# Patient Record
Sex: Male | Born: 1945
Health system: Southern US, Community
[De-identification: ages and names within clinical notes are randomized; demographics above are authoritative.]

## PROBLEM LIST (undated history)

## (undated) DIAGNOSIS — E039 Hypothyroidism, unspecified: Secondary | ICD-10-CM

## (undated) DIAGNOSIS — K4090 Unilateral inguinal hernia, without obstruction or gangrene, not specified as recurrent: Secondary | ICD-10-CM

## (undated) DIAGNOSIS — I10 Essential (primary) hypertension: Secondary | ICD-10-CM

## (undated) DIAGNOSIS — E785 Hyperlipidemia, unspecified: Secondary | ICD-10-CM

## (undated) DIAGNOSIS — C801 Malignant (primary) neoplasm, unspecified: Secondary | ICD-10-CM

## (undated) HISTORY — DX: Hyperlipidemia, unspecified: E78.5

## (undated) HISTORY — PX: BASAL CELL CARCINOMA EXCISION: SHX1214

## (undated) HISTORY — DX: Essential (primary) hypertension: I10

## (undated) HISTORY — DX: Malignant (primary) neoplasm, unspecified: C80.1

## (undated) HISTORY — DX: Hypothyroidism, unspecified: E03.9

## (undated) HISTORY — DX: Unilateral inguinal hernia, without obstruction or gangrene, not specified as recurrent: K40.90

---

## 2009-05-22 DIAGNOSIS — I10 Essential (primary) hypertension: Secondary | ICD-10-CM | POA: Insufficient documentation

## 2009-07-25 ENCOUNTER — Ambulatory Visit: Payer: Self-pay | Admitting: Gastroenterology

## 2009-07-25 LAB — HM COLONOSCOPY: HM Colonoscopy: NORMAL

## 2009-10-23 DIAGNOSIS — N399 Disorder of urinary system, unspecified: Secondary | ICD-10-CM | POA: Insufficient documentation

## 2009-10-23 DIAGNOSIS — E039 Hypothyroidism, unspecified: Secondary | ICD-10-CM | POA: Insufficient documentation

## 2009-11-17 DIAGNOSIS — G4730 Sleep apnea, unspecified: Secondary | ICD-10-CM | POA: Insufficient documentation

## 2009-11-17 DIAGNOSIS — E669 Obesity, unspecified: Secondary | ICD-10-CM | POA: Insufficient documentation

## 2010-09-08 DIAGNOSIS — C801 Malignant (primary) neoplasm, unspecified: Secondary | ICD-10-CM

## 2010-09-08 HISTORY — DX: Malignant (primary) neoplasm, unspecified: C80.1

## 2010-09-08 HISTORY — PX: COLONOSCOPY: SHX174

## 2013-06-14 ENCOUNTER — Ambulatory Visit (INDEPENDENT_AMBULATORY_CARE_PROVIDER_SITE_OTHER): Payer: Managed Care, Other (non HMO) | Admitting: General Surgery

## 2013-06-14 ENCOUNTER — Encounter: Payer: Self-pay | Admitting: General Surgery

## 2013-06-14 VITALS — BP 140/76 | HR 80 | Resp 12 | Ht 70.0 in | Wt 220.0 lb

## 2013-06-14 DIAGNOSIS — K4090 Unilateral inguinal hernia, without obstruction or gangrene, not specified as recurrent: Secondary | ICD-10-CM

## 2013-06-14 NOTE — Progress Notes (Signed)
Patient ID: Larry Hernandez, male   DOB: [birth year], 67 y.o.   MRN: [id number]  Chief Complaint  Patient presents with  . Other    inguinal hernia    HPI Larry Hernandez is a 67 y.o. male here today for evaluation of a left inguinal hernia . Patient states that DR. Sullivan Lone    noticed this about three weeks ago in his yearly physical and wanted to get it looked at. Patient states no pain in that area. HPI  Past Medical History  Diagnosis Date  . Hypertension   . Hypothyroid   . Cancer 2012    skin caner face and ear    Past Surgical History  Procedure Laterality Date  . Colonoscopy  2012    History reviewed. No pertinent family history.  Social History History  Substance Use Topics  . Smoking status: Never Smoker   . Smokeless tobacco: Never Used  . Alcohol Use: Not on file    No Known Allergies  Current Outpatient Prescriptions  Medication Sig Dispense Refill  . levothyroxine (SYNTHROID, LEVOTHROID) 100 MCG tablet Take 100 mcg by mouth daily before breakfast.       . simvastatin (ZOCOR) 10 MG tablet Take 10 mg by mouth at bedtime.       [name] aspirin 325 MG tablet Take 325 mg by mouth as needed for pain.       No current facility-administered medications for this visit.    Review of Systems Review of Systems  Constitutional: Negative.   Respiratory: Negative.     Blood pressure 140/76, pulse 80, resp. rate 12, height 5\' 10"  (1.778 m), weight 220 lb (99.791 kg).  Physical Exam Physical Exam  Constitutional: He is oriented to person, place, and time. He appears well-developed and well-nourished.  Eyes: Conjunctivae are normal. No scleral icterus.  Neck: No mass and no thyromegaly present.  Cardiovascular: Normal rate, regular rhythm and normal heart sounds.   Pulmonary/Chest: Breath sounds normal.  Abdominal: Soft. Normal appearance and bowel sounds are normal. There is no hepatomegaly. There is no tenderness. A hernia is present. Hernia confirmed positive in the left  inguinal area (medium).  Lymphadenopathy:    He has no cervical adenopathy.  Neurological: He is alert and oriented to person, place, and time.  Skin: Skin is warm and dry.    Data Reviewed   Assessment   Medium sized left inguinal hernia. Pt is still working and will benefit with repair.   Plan    Discussed inguinal hernia repair. Patient wishes to wait closer to his retirement in 6 months. Will return at that time. Hernia complications and precautions explained to the patient.        Larry Hernandez 06/14/2013, 6:46 PM

## 2013-06-14 NOTE — Patient Instructions (Addendum)

## 2013-12-15 ENCOUNTER — Ambulatory Visit: Payer: Managed Care, Other (non HMO) | Admitting: General Surgery

## 2013-12-21 ENCOUNTER — Ambulatory Visit (INDEPENDENT_AMBULATORY_CARE_PROVIDER_SITE_OTHER): Payer: Managed Care, Other (non HMO) | Admitting: General Surgery

## 2013-12-21 ENCOUNTER — Encounter: Payer: Self-pay | Admitting: General Surgery

## 2013-12-21 VITALS — BP 138/82 | HR 60 | Resp 12 | Ht 70.0 in | Wt 223.0 lb

## 2013-12-21 DIAGNOSIS — K4090 Unilateral inguinal hernia, without obstruction or gangrene, not specified as recurrent: Secondary | ICD-10-CM

## 2013-12-21 NOTE — Progress Notes (Signed)
Patient ID: Larry Hernandez, male   DOB: [birth year], 68 y.o.   MRN: [id number]  Chief Complaint  Patient presents with  . Routine Post Op    left inguinal hernia    HPI Larry Hernandez is a 68 y.o. male.  Here today for 6 months follow up left inguinal hernia. States he is not having any pain. Wants to consider having it repaired in the fall. HPI  Past Medical History  Diagnosis Date  . Hypertension   . Hypothyroid   . Cancer 2012    skin caner face and ear  . Hernia, inguinal     Past Surgical History  Procedure Laterality Date  . Colonoscopy  2012    History reviewed. No pertinent family history.  Social History History  Substance Use Topics  . Smoking status: Never Smoker   . Smokeless tobacco: Never Used  . Alcohol Use: Not on file    No Known Allergies  Current Outpatient Prescriptions  Medication Sig Dispense Refill  . aspirin 325 MG tablet Take 325 mg by mouth 2 (two) times a week.       . levothyroxine (SYNTHROID, LEVOTHROID) 100 MCG tablet Take 100 mcg by mouth daily before breakfast.       . simvastatin (ZOCOR) 10 MG tablet Take 10 mg by mouth at bedtime.        No current facility-administered medications for this visit.    Review of Systems Review of Systems  Constitutional: Negative.   Respiratory: Negative.   Cardiovascular: Negative.   Gastrointestinal: Negative for diarrhea and constipation.    Blood pressure 138/82, pulse 60, resp. rate 12, height 5\' 10"  (1.778 m), weight 223 lb (101.152 kg).  Physical Exam Physical Exam  Constitutional: He is oriented to person, place, and time. He appears well-developed and well-nourished.  Abdominal: Soft. Normal appearance. A hernia is present. Hernia confirmed positive in the left inguinal area.  Medium size reducible hernia, left inguinal hernia. This is unchanged from 6 months ago.  Neurological: He is alert and oriented to person, place, and time.  Skin: Skin is warm and dry.    Data  Reviewed none  Assessment    Left inguinal hernia remains asymptomatic.    Plan    Patient wishes to have repair done in the fall. He will return then for scheduling.       Larry Hernandez 12/22/2013, 8:53 AM

## 2013-12-21 NOTE — Patient Instructions (Addendum)
The patient is aware to call back for any questions or concerns.  

## 2013-12-22 ENCOUNTER — Encounter: Payer: Self-pay | Admitting: General Surgery

## 2014-04-13 ENCOUNTER — Encounter: Payer: Self-pay | Admitting: *Deleted

## 2014-04-13 NOTE — Progress Notes (Signed)
Patient ID: Larry Hernandez, male   DOB: 09-17-1945, 68 y.o.   MRN: [id number]  Patient called the office wanting to arrange a date for surgery in October. He is working around a trip that he already has planned. This patient's left inguinal hernia repair has been scheduled at [facility] for 06-13-14.  Dr. Jamal Collin will discuss with patient laparoscopic versus open repair at time of pre-op visit that is scheduled for 05-24-14. O.R. will be notified once patient has decided which way he would like to proceed.  Further instructions will be reviewed with this patient at time of pre-op visit.

## 2014-05-24 ENCOUNTER — Ambulatory Visit (INDEPENDENT_AMBULATORY_CARE_PROVIDER_SITE_OTHER): Payer: Managed Care, Other (non HMO) | Admitting: General Surgery

## 2014-05-24 ENCOUNTER — Ambulatory Visit: Payer: Managed Care, Other (non HMO) | Admitting: General Surgery

## 2014-05-24 ENCOUNTER — Encounter: Payer: Self-pay | Admitting: General Surgery

## 2014-05-24 VITALS — BP 140/90 | HR 64 | Resp 12 | Ht 70.0 in | Wt 212.0 lb

## 2014-05-24 DIAGNOSIS — R0989 Other specified symptoms and signs involving the circulatory and respiratory systems: Secondary | ICD-10-CM

## 2014-05-24 DIAGNOSIS — K4090 Unilateral inguinal hernia, without obstruction or gangrene, not specified as recurrent: Secondary | ICD-10-CM | POA: Diagnosis not present

## 2014-05-24 NOTE — Patient Instructions (Addendum)
Patient to be scheduled for left inguinal hernia repair. The patient is aware to call back for any questions or concerns.  Hernia Repair with Laparoscope A hernia occurs when an internal organ pushes out through a weak spot in the belly (abdominal) wall muscles. Hernias most commonly occur in the groin and around the navel. Hernias can also occur through a cut by the surgeon (incision) after an abdominal operation. A hernia may be caused by:  Lifting heavy objects.  Prolonged coughing.  Straining to move your bowels. Hernias can often be pushed back into place (reduced). Most hernias tend to get worse over time. Problems occur when abdominal contents get stuck in the opening and the blood supply is blocked or impaired (incarcerated hernia). Because of these risks, you require surgery to repair the hernia. Your hernia will be repaired using a laparoscope. Laparoscopic surgery is a type of minimally invasive surgery. It does not involve making a typical surgical cut (incision) in the skin. A laparoscope is a telescope-like rod and lens system. It is usually connected to a video camera and a light source so your caregiver can clearly see the operative area. The instruments are inserted through  to  inch (5 mm or 10 mm) openings in the skin at specific locations. A working and viewing space is created by blowing a small amount of carbon dioxide gas into the abdominal cavity. The abdomen is essentially blown up like a balloon (insufflated). This elevates the abdominal wall above the internal organs like a dome. The carbon dioxide gas is common to the human body and can be absorbed by tissue and removed by the respiratory system. Once the repair is completed, the small incisions will be closed with either stitches (sutures) or staples (just like a paper stapler only this staple holds the skin together). LET YOUR CAREGIVERS KNOW ABOUT:  Allergies.  Medications taken including herbs, eye drops, over the  counter medications, and creams.  Use of steroids (by mouth or creams).  Previous problems with anesthetics or Novocaine.  Possibility of pregnancy, if this applies.  History of blood clots (thrombophlebitis).  History of bleeding or blood problems.  Previous surgery.  Other health problems. BEFORE THE PROCEDURE  Laparoscopy can be done either in a hospital or out-patient clinic. You may be given a mild sedative to help you relax before the procedure. Once in the operating room, you will be given a general anesthesia to make you sleep (unless you and your caregiver choose a different anesthetic).  AFTER THE PROCEDURE  After the procedure you will be watched in a recovery area. Depending on what type of hernia was repaired, you might be admitted to the hospital or you might go home the same day. With this procedure you may have less pain and scarring. This usually results in a quicker recovery and less risk of infection. HOME CARE INSTRUCTIONS   Bed rest is not required. You may continue your normal activities but avoid heavy lifting (more than 10 pounds) or straining.  Cough gently. If you are a smoker it is best to stop, as even the best hernia repair can break down with the continual strain of coughing.  Avoid driving until given the OK by your surgeon.  There are no dietary restrictions unless given otherwise.  TAKE ALL MEDICATIONS AS DIRECTED.  Only take over-the-counter or prescription medicines for pain, discomfort, or fever as directed by your caregiver. SEEK MEDICAL CARE IF:   There is increasing abdominal pain or pain in  your incisions.  There is more bleeding from incisions, other than minimal spotting.  You feel light headed or faint.  You develop an unexplained fever, chills, and/or an oral temperature above 102 F (38.9 C).  You have redness, swelling, or increasing pain in the wound.  Pus coming from wound.  A foul smell coming from the wound or  dressings. SEEK IMMEDIATE MEDICAL CARE IF:   You develop a rash.  You have difficulty breathing.  You have any allergic problems. MAKE SURE YOU:   Understand these instructions.  Will watch your condition.  Will get help right away if you are not doing well or get worse. Document Released: 08/25/2005 Document Revised: 11/17/2011 Document Reviewed: 07/25/2009 [facility] Patient Information 2015 [place], [place]. This information is not intended to replace advice given to you by your health care provider. Make sure you discuss any questions you have with your health care provider.  Ultrasound has been scheduled at [facility] for 05-29-14 at 9 am (arrive 8:45 am). Prep: NPO 8 hours prior.   Patient's surgery has been scheduled for 06-13-14 at [facility]. It is okay for patient to continue 325 mg aspirin twice a week; however, he will not make use of this the day before or the day of surgery.

## 2014-05-24 NOTE — Progress Notes (Signed)
Patient ID: Larry Hernandez, male   DOB: 10-12-1945, 68 y.o.   MRN: [id number]  Chief Complaint  Patient presents with  . Follow-up    pre-op left inguinal hernia repair    HPI Larry Hernandez is a 68 y.o. male who presents for a pre-op evaluation for a left inguinal hernia repair. No new complaints at this time.   HPI  Past Medical History  Diagnosis Date  . Hypertension   . Hypothyroid   . Cancer 2012    skin caner face and ear  . Hernia, inguinal     Past Surgical History  Procedure Laterality Date  . Colonoscopy  2012    History reviewed. No pertinent family history.  Social History History  Substance Use Topics  . Smoking status: Never Smoker   . Smokeless tobacco: Never Used  . Alcohol Use: No    No Known Allergies  Current Outpatient Prescriptions  Medication Sig Dispense Refill  . aspirin 325 MG tablet Take 325 mg by mouth 2 (two) times a week.       . levothyroxine (SYNTHROID, LEVOTHROID) 100 MCG tablet Take 100 mcg by mouth daily before breakfast.       . simvastatin (ZOCOR) 10 MG tablet Take 10 mg by mouth at bedtime.       . tadalafil (CIALIS) 20 MG tablet Take 20 mg by mouth daily as needed for erectile dysfunction.       No current facility-administered medications for this visit.    Review of Systems Review of Systems  Constitutional: Negative.   Respiratory: Negative.   Cardiovascular: Negative.   Gastrointestinal: Negative.     Blood pressure 140/90, pulse 64, resp. rate 12, height 5\' 10"  (1.778 m), weight 212 lb (96.163 kg).  Physical Exam Physical Exam  Constitutional: He is oriented to person, place, and time. He appears well-developed and well-nourished.  Eyes: Conjunctivae are normal. No scleral icterus.  Neck: Neck supple. No thyromegaly present.  Cardiovascular: Normal rate, regular rhythm and normal heart sounds.   No murmur heard. Pulmonary/Chest: Effort normal and breath sounds normal.  Abdominal: Soft. Normal appearance and  bowel sounds are normal. There is no hepatosplenomegaly. There is no tenderness. A hernia is present. Hernia confirmed positive in the left inguinal area.    Lymphadenopathy:    He has no cervical adenopathy.  Neurological: He is alert and oriented to person, place, and time.  Skin: Skin is warm and dry.    Data Reviewed  prior notes  Assessment    Left ing hernia. Possible AAA      Plan    [place] of Aorta. If ok will proceed with repair of ing hernia. Procedure of laprascopic repair, risks and benefits explained and patient is agreeable.      Ultrasound has been scheduled at [facility] for 05-29-14 at 9 am (arrive 8:45 am). Prep: NPO 8 hours prior.   Patient's surgery has been scheduled for 06-13-14 at [facility]. It is okay for patient to continue 325 mg aspirin twice a week; however, he will not make use of this the day before or the day of surgery.    Larry Hernandez 05/24/2014, 7:22 PM

## 2014-05-26 ENCOUNTER — Other Ambulatory Visit: Payer: Self-pay | Admitting: General Surgery

## 2014-05-26 DIAGNOSIS — K4090 Unilateral inguinal hernia, without obstruction or gangrene, not specified as recurrent: Secondary | ICD-10-CM

## 2014-05-29 ENCOUNTER — Ambulatory Visit: Payer: Self-pay | Admitting: General Surgery

## 2014-05-29 DIAGNOSIS — N200 Calculus of kidney: Secondary | ICD-10-CM | POA: Diagnosis not present

## 2014-05-29 DIAGNOSIS — R0989 Other specified symptoms and signs involving the circulatory and respiratory systems: Secondary | ICD-10-CM | POA: Diagnosis not present

## 2014-05-29 DIAGNOSIS — I77819 Aortic ectasia, unspecified site: Secondary | ICD-10-CM | POA: Diagnosis not present

## 2014-05-29 DIAGNOSIS — I77811 Abdominal aortic ectasia: Secondary | ICD-10-CM | POA: Diagnosis not present

## 2014-05-30 ENCOUNTER — Ambulatory Visit: Payer: Self-pay | Admitting: General Surgery

## 2014-05-30 DIAGNOSIS — I10 Essential (primary) hypertension: Secondary | ICD-10-CM | POA: Diagnosis not present

## 2014-05-30 DIAGNOSIS — Z01812 Encounter for preprocedural laboratory examination: Secondary | ICD-10-CM | POA: Diagnosis not present

## 2014-05-30 DIAGNOSIS — K4090 Unilateral inguinal hernia, without obstruction or gangrene, not specified as recurrent: Secondary | ICD-10-CM | POA: Diagnosis not present

## 2014-05-30 DIAGNOSIS — Z01810 Encounter for preprocedural cardiovascular examination: Secondary | ICD-10-CM | POA: Diagnosis not present

## 2014-05-30 LAB — BASIC METABOLIC PANEL
Anion Gap: 4 — ABNORMAL LOW (ref 7–16)
BUN: 22 mg/dL — ABNORMAL HIGH (ref 7–18)
CO2: 28 mmol/L (ref 21–32)
Calcium, Total: 8.1 mg/dL — ABNORMAL LOW (ref 8.5–10.1)
Chloride: 109 mmol/L — ABNORMAL HIGH (ref 98–107)
Creatinine: 1.46 mg/dL — ABNORMAL HIGH (ref 0.60–1.30)
EGFR (Non-African Amer.): 49 — ABNORMAL LOW
GFR CALC AF AMER: 57 — AB
GLUCOSE: 96 mg/dL (ref 65–99)
OSMOLALITY: 284 (ref 275–301)
Potassium: 4.2 mmol/L (ref 3.5–5.1)
Sodium: 141 mmol/L (ref 136–145)

## 2014-05-30 LAB — CBC WITH DIFFERENTIAL/PLATELET
BASOS ABS: 0 10*3/uL (ref 0.0–0.1)
BASOS PCT: 0.6 %
EOS ABS: 0.2 10*3/uL (ref 0.0–0.7)
EOS PCT: 3.5 %
HCT: 44.9 % (ref 40.0–52.0)
HGB: 14.2 g/dL (ref 13.0–18.0)
LYMPHS PCT: 31.3 %
Lymphocyte #: 1.5 10*3/uL (ref 1.0–3.6)
MCH: 30.3 pg (ref 26.0–34.0)
MCHC: 31.6 g/dL — ABNORMAL LOW (ref 32.0–36.0)
MCV: 96 fL (ref 80–100)
Monocyte #: 0.4 x10 3/mm (ref 0.2–1.0)
Monocyte %: 7.7 %
NEUTROS PCT: 56.9 %
Neutrophil #: 2.7 10*3/uL (ref 1.4–6.5)
Platelet: 184 10*3/uL (ref 150–440)
RBC: 4.68 10*6/uL (ref 4.40–5.90)
RDW: 13.2 % (ref 11.5–14.5)
WBC: 4.8 10*3/uL (ref 3.8–10.6)

## 2014-05-31 ENCOUNTER — Encounter: Payer: Self-pay | Admitting: General Surgery

## 2014-06-06 ENCOUNTER — Telehealth: Payer: Self-pay | Admitting: *Deleted

## 2014-06-06 NOTE — Telephone Encounter (Signed)
Pt called stating that you left him a message on his answering machine last night and so he is now calling you back.

## 2014-06-13 ENCOUNTER — Encounter: Payer: Self-pay | Admitting: General Surgery

## 2014-06-13 ENCOUNTER — Ambulatory Visit: Payer: Self-pay | Admitting: General Surgery

## 2014-06-13 DIAGNOSIS — K4090 Unilateral inguinal hernia, without obstruction or gangrene, not specified as recurrent: Secondary | ICD-10-CM | POA: Diagnosis not present

## 2014-06-13 DIAGNOSIS — Z7982 Long term (current) use of aspirin: Secondary | ICD-10-CM | POA: Diagnosis not present

## 2014-06-13 DIAGNOSIS — Z79899 Other long term (current) drug therapy: Secondary | ICD-10-CM | POA: Diagnosis not present

## 2014-06-13 DIAGNOSIS — Z87442 Personal history of urinary calculi: Secondary | ICD-10-CM | POA: Diagnosis not present

## 2014-06-13 DIAGNOSIS — E039 Hypothyroidism, unspecified: Secondary | ICD-10-CM | POA: Diagnosis not present

## 2014-06-13 DIAGNOSIS — Z85828 Personal history of other malignant neoplasm of skin: Secondary | ICD-10-CM | POA: Diagnosis not present

## 2014-06-13 DIAGNOSIS — I10 Essential (primary) hypertension: Secondary | ICD-10-CM | POA: Diagnosis not present

## 2014-06-13 HISTORY — PX: HERNIA REPAIR: SHX51

## 2014-06-22 ENCOUNTER — Encounter: Payer: Self-pay | Admitting: General Surgery

## 2014-06-22 ENCOUNTER — Ambulatory Visit (INDEPENDENT_AMBULATORY_CARE_PROVIDER_SITE_OTHER): Payer: Self-pay | Admitting: General Surgery

## 2014-06-22 VITALS — BP 130/68 | HR 72 | Resp 14 | Ht 70.0 in | Wt 205.0 lb

## 2014-06-22 DIAGNOSIS — K4090 Unilateral inguinal hernia, without obstruction or gangrene, not specified as recurrent: Secondary | ICD-10-CM

## 2014-06-22 NOTE — Patient Instructions (Addendum)
The patient is aware to call back for any questions or concerns. May increase activity as tolerated. Follow up in 1 month.

## 2014-06-22 NOTE — Progress Notes (Signed)
Here today for postoperative visit, left inguinal hernia on 06-13-14. States he is doing well.  Port sites are healing well. There is some mild firmness in the left inguinal region. Likely post op. No hernia noted. Abdomen otherwise soft and non tender.   May increase activity as tolerated. Follow up in 1 month.

## 2014-06-30 DIAGNOSIS — Z23 Encounter for immunization: Secondary | ICD-10-CM | POA: Diagnosis not present

## 2014-07-04 DIAGNOSIS — D225 Melanocytic nevi of trunk: Secondary | ICD-10-CM | POA: Diagnosis not present

## 2014-07-04 DIAGNOSIS — L821 Other seborrheic keratosis: Secondary | ICD-10-CM | POA: Diagnosis not present

## 2014-07-04 DIAGNOSIS — X32XXXA Exposure to sunlight, initial encounter: Secondary | ICD-10-CM | POA: Diagnosis not present

## 2014-07-04 DIAGNOSIS — Z85828 Personal history of other malignant neoplasm of skin: Secondary | ICD-10-CM | POA: Diagnosis not present

## 2014-07-04 DIAGNOSIS — L570 Actinic keratosis: Secondary | ICD-10-CM | POA: Diagnosis not present

## 2014-07-05 ENCOUNTER — Encounter: Payer: Self-pay | Admitting: General Surgery

## 2014-07-27 ENCOUNTER — Ambulatory Visit (INDEPENDENT_AMBULATORY_CARE_PROVIDER_SITE_OTHER): Payer: Self-pay | Admitting: General Surgery

## 2014-07-27 ENCOUNTER — Encounter: Payer: Self-pay | Admitting: General Surgery

## 2014-07-27 VITALS — BP 130/74 | HR 72 | Resp 14 | Ht 70.0 in | Wt 206.0 lb

## 2014-07-27 DIAGNOSIS — K4090 Unilateral inguinal hernia, without obstruction or gangrene, not specified as recurrent: Secondary | ICD-10-CM

## 2014-07-27 DIAGNOSIS — Z125 Encounter for screening for malignant neoplasm of prostate: Secondary | ICD-10-CM | POA: Diagnosis not present

## 2014-07-27 DIAGNOSIS — Z0000 Encounter for general adult medical examination without abnormal findings: Secondary | ICD-10-CM | POA: Diagnosis not present

## 2014-07-27 DIAGNOSIS — Z1389 Encounter for screening for other disorder: Secondary | ICD-10-CM | POA: Diagnosis not present

## 2014-07-27 LAB — PSA: PSA: 1.8

## 2014-07-27 NOTE — Progress Notes (Signed)
Patient ID: Larry Hernandez, male   DOB: 1946/03/04, 68 y.o.   MRN: [id number]   The patient presents for a 1 month post op lap left inguinal hernia repair. The procedure was performed on 06/13/14. The patient is doing well. No new complaints at this time  [place] sites are healed. No hernia noted in groin. Abdomen is soft, non tender Patient to return as needed.

## 2014-07-27 NOTE — Patient Instructions (Signed)
Patient to return as needed. The patient is aware to call back for any questions or concerns. 

## 2014-07-28 ENCOUNTER — Encounter: Payer: Self-pay | Admitting: General Surgery

## 2014-12-20 DIAGNOSIS — Z23 Encounter for immunization: Secondary | ICD-10-CM | POA: Diagnosis not present

## 2014-12-20 DIAGNOSIS — E039 Hypothyroidism, unspecified: Secondary | ICD-10-CM | POA: Diagnosis not present

## 2014-12-20 DIAGNOSIS — I10 Essential (primary) hypertension: Secondary | ICD-10-CM | POA: Diagnosis not present

## 2014-12-20 DIAGNOSIS — E780 Pure hypercholesterolemia: Secondary | ICD-10-CM | POA: Diagnosis not present

## 2014-12-20 DIAGNOSIS — E785 Hyperlipidemia, unspecified: Secondary | ICD-10-CM | POA: Diagnosis not present

## 2014-12-20 LAB — CBC AND DIFFERENTIAL
HEMATOCRIT: 44 % (ref 41–53)
Hemoglobin: 14.6 g/dL (ref 13.5–17.5)
Neutrophils Absolute: 3 /uL
Platelets: 204 10*3/uL (ref 150–399)

## 2014-12-20 LAB — HEPATIC FUNCTION PANEL
ALK PHOS: 61 U/L (ref 25–125)
ALT: 11 U/L (ref 10–40)
AST: 18 U/L (ref 14–40)
BILIRUBIN, TOTAL: 1.5 mg/dL

## 2014-12-20 LAB — BASIC METABOLIC PANEL
BUN: 22 mg/dL — AB (ref 4–21)
Creatinine: 1.4 mg/dL — AB (ref 0.6–1.3)
GLUCOSE: 74 mg/dL
Potassium: 4.9 mmol/L (ref 3.4–5.3)
Sodium: 143 mmol/L (ref 137–147)

## 2014-12-20 LAB — TSH: TSH: 0.18 u[IU]/mL — AB (ref 0.41–5.90)

## 2014-12-20 LAB — LIPID PANEL
Cholesterol: 192 mg/dL (ref 0–200)
HDL: 36 mg/dL (ref 35–70)
LDL CALC: 146 mg/dL
LDl/HDL Ratio: 4.1
TRIGLYCERIDES: 51 mg/dL (ref 40–160)

## 2014-12-30 NOTE — Op Note (Signed)
PATIENT NAME:  Larry Hernandez, Larry Hernandez MR#:  [id number] DATE OF BIRTH:  September 01, 1946  DATE OF PROCEDURE:  06/13/2014  PREOPERATIVE DIAGNOSIS: Left inguinal hernia.   POSTOPERATIVE DIAGNOSIS: Left inguinal hernia, indirect variety.   OPERATION PERFORMED: Laparoscopy and repair of left inguinal hernia with use of Bard 3-D mesh.   SURGEON: Mckinley Jewel, M.D.   ANESTHESIA: General.   COMPLICATIONS: None.   ESTIMATED BLOOD LOSS: Minimal.   DRAINS: None.   DESCRIPTION OF PROCEDURE: The patient was put to sleep in the supine position on the operating table. Foley catheter was inserted and removed at the end of the procedure. The abdomen was prepped and draped out as a sterile field. Timeout was performed.   Initial entry was made near the umbilicus. A small port incision was made, and through this, a Veress needle with an InnerDyne sleeve was positioned in the peritoneal cavity, verified with hanging drop method. Pneumoperitoneum was obtained and a 10 mm port was placed. With the camera in place, there was good visualization showing that there was a fibrous band of omental adhesion just superior to the left inguinal region. The right inguinal area showed no abnormality. There was a large indirect hernia located in the left groin. There did not appear to be any contents going into the hernia at this time.   A left lateral 5 mm port and a right lateral 11 mm port were placed. The fibrous adhesion was then taken down with cautery and freed away from the inguinal region. The peritoneum overlying the inguinal canal was incised from the lateral to the medial aspect and dissected down, and additional dissection was performed to expose the cord area. The inferior epigastric vessel was identified, the pubic tubercle was identified, and a sac along with a large lipoma of the cord were then freed from the inguinal region and peeled away back towards the peritoneal area. After this was adequately mobilized and freed, the  vas deferens was identified and this was noted to be intact. The Bard 3-D mesh, medium size, was positioned in this region. Secure strap was used to tack it to the pubic tubercle and the muscular tissue medially and the inguinal ligament below, and a few along the superior edge. The lateral edges were left alone.   A satisfactory reinforced repair was obtained. The peritoneal covering was then placed over the mesh and reapproximated with the secure strap. Then ensuring that this area was satisfactorily closed, the fascial openings at the umbilicus and right mid abdomen were closed with the use of a suture passer using 0 Vicryl. The pneumoperitoneum was released and then the remaining ports were removed. The skin incisions were closed with subcuticular 4-0 Vicryl, covered with Dermabond. The procedure was well tolerated and he was subsequently extubated and returned to the recovery room in stable condition.   ____________________________ S.Robinette Haines, MD sgs:JT D: 06/13/2014 09:24:45 ET T: 06/13/2014 12:09:00 ET JOB#: [id number]  cc: S.G. Jamal Collin, MD, <Dictator> [facility] Robinette Haines MD ELECTRONICALLY SIGNED 06/14/2014 9:01

## 2015-01-11 DIAGNOSIS — H40013 Open angle with borderline findings, low risk, bilateral: Secondary | ICD-10-CM | POA: Diagnosis not present

## 2015-04-11 ENCOUNTER — Other Ambulatory Visit: Payer: Self-pay

## 2015-04-11 DIAGNOSIS — R935 Abnormal findings on diagnostic imaging of other abdominal regions, including retroperitoneum: Secondary | ICD-10-CM

## 2015-04-24 DIAGNOSIS — H40023 Open angle with borderline findings, high risk, bilateral: Secondary | ICD-10-CM | POA: Diagnosis not present

## 2015-05-16 ENCOUNTER — Telehealth: Payer: Self-pay | Admitting: *Deleted

## 2015-05-16 NOTE — Telephone Encounter (Signed)
Message for patient to call the office.  We need to reschedule patient's appointment that is on 06-07-15 due to Dr. Jamal Collin being out of town. Please make sure office appointment is after ultrasound at [facility] scheduled for 06-01-15.

## 2015-05-17 DIAGNOSIS — Z23 Encounter for immunization: Secondary | ICD-10-CM | POA: Diagnosis not present

## 2015-06-01 ENCOUNTER — Ambulatory Visit
Admission: RE | Admit: 2015-06-01 | Discharge: 2015-06-01 | Disposition: A | Discharge status: Home / Self Care | Payer: Medicare Other | Source: Ambulatory Visit | Attending: General Surgery | Admitting: General Surgery

## 2015-06-01 DIAGNOSIS — N200 Calculus of kidney: Secondary | ICD-10-CM | POA: Diagnosis not present

## 2015-06-01 DIAGNOSIS — I77811 Abdominal aortic ectasia: Secondary | ICD-10-CM | POA: Diagnosis not present

## 2015-06-01 DIAGNOSIS — R935 Abnormal findings on diagnostic imaging of other abdominal regions, including retroperitoneum: Secondary | ICD-10-CM

## 2015-06-07 ENCOUNTER — Ambulatory Visit: Payer: Medicare Other | Admitting: General Surgery

## 2015-06-11 ENCOUNTER — Ambulatory Visit (INDEPENDENT_AMBULATORY_CARE_PROVIDER_SITE_OTHER): Payer: Medicare Other | Admitting: General Surgery

## 2015-06-11 ENCOUNTER — Encounter: Payer: Self-pay | Admitting: General Surgery

## 2015-06-11 VITALS — BP 134/74 | HR 80 | Resp 12 | Ht 70.0 in | Wt 225.0 lb

## 2015-06-11 DIAGNOSIS — I719 Aortic aneurysm of unspecified site, without rupture: Secondary | ICD-10-CM | POA: Diagnosis not present

## 2015-06-11 DIAGNOSIS — R0989 Other specified symptoms and signs involving the circulatory and respiratory systems: Secondary | ICD-10-CM

## 2015-06-11 NOTE — Progress Notes (Signed)
Patient ID: Larry Hernandez, male   DOB: 18-Apr-1946, 69 y.o.   MRN: [id number]  Chief Complaint  Patient presents with  . Follow-up    Abdominal aorta    HPI Larry Hernandez is a 69 y.o. male here today for a one year follow up on abdominal aorta, patient had an ultrasound done on 06/01/15. He states no new changes. He is able to walk distances without trouble.  He had a left inguinal hernia repair in 2015. HPI   Past Medical History  Diagnosis Date  . Hypertension   . Hypothyroid   . Cancer [facility]) 2012    skin caner face and ear  . Hernia, inguinal     Past Surgical History  Procedure Laterality Date  . Colonoscopy  2012  . Hernia repair Left 06-13-14    inguinal    History reviewed. No pertinent family history.  Social History Social History  Substance Use Topics  . Smoking status: Never Smoker   . Smokeless tobacco: Never Used  . Alcohol Use: No    No Known Allergies  Current Outpatient Prescriptions  Medication Sig Dispense Refill  . aspirin 325 MG tablet Take 325 mg by mouth 2 (two) times a week.     . levothyroxine (SYNTHROID, LEVOTHROID) 100 MCG tablet Take 7.5 mcg by mouth daily before breakfast.     . simvastatin (ZOCOR) 10 MG tablet Take 10 mg by mouth at bedtime.     . tadalafil (CIALIS) 20 MG tablet Take 20 mg by mouth daily as needed for erectile dysfunction.     No current facility-administered medications for this visit.    Review of Systems Review of Systems  Constitutional: Negative.   Respiratory: Negative.   Cardiovascular: Negative.     Blood pressure 134/74, pulse 80, resp. rate 12, height 5\' 10"  (1.778 m), weight 225 lb (102.059 kg).  Physical Exam Physical Exam  Constitutional: He is oriented to person, place, and time. He appears well-developed and well-nourished.  Cardiovascular: Normal rate, regular rhythm and normal heart sounds.   Pulses:      Carotid pulses are 2+ on the right side, and 2+ on the left side.      Radial pulses  are 2+ on the right side, and 2+ on the left side.       Femoral pulses are 2+ on the right side, and 2+ on the left side.      Dorsalis pedis pulses are 2+ on the right side, and 2+ on the left side.       Posterior tibial pulses are 2+ on the right side, and 2+ on the left side.  No bruits heard in carotid, aorta, or iliacs.  Pulmonary/Chest: Effort normal and breath sounds normal.  Abdominal: Soft. Bowel sounds are normal.  Previous repaired left inguinal hernia shows no recurrence.  Prominent epigastric pulse, mildly expansive.  Neurological: He is alert and oriented to person, place, and time.  Skin: Skin is warm and dry.    Data Reviewed Prior notes and images. Ultrasound of abdominal aorta showing mild increase from last year, current measurement is 3.5 cm infrarenal aneurysm. Mild ectasia of iliacs  Assessment    Abdominal aorta aneurysm, increased slightly from last year but still relatively small.    Plan    Return in 1 year with follow-up ultrasound of abdominal aorta.     PCP: Dr. Shearon Balo 06/13/2015, 7:58 AM

## 2015-06-11 NOTE — Patient Instructions (Signed)
Return in 1 year with follow up abdominal aorta ultrasound.

## 2015-06-13 ENCOUNTER — Encounter: Payer: Self-pay | Admitting: General Surgery

## 2015-06-20 ENCOUNTER — Ambulatory Visit: Payer: Self-pay | Admitting: Family Medicine

## 2015-06-26 ENCOUNTER — Encounter: Payer: Self-pay | Admitting: Emergency Medicine

## 2015-06-26 ENCOUNTER — Ambulatory Visit (INDEPENDENT_AMBULATORY_CARE_PROVIDER_SITE_OTHER): Payer: Medicare Other | Admitting: Family Medicine

## 2015-06-26 VITALS — BP 132/70 | HR 64 | Temp 97.6°F | Resp 16 | Wt 213.0 lb

## 2015-06-26 DIAGNOSIS — L570 Actinic keratosis: Secondary | ICD-10-CM | POA: Insufficient documentation

## 2015-06-26 DIAGNOSIS — I10 Essential (primary) hypertension: Secondary | ICD-10-CM | POA: Diagnosis not present

## 2015-06-26 DIAGNOSIS — E039 Hypothyroidism, unspecified: Secondary | ICD-10-CM | POA: Diagnosis not present

## 2015-06-26 DIAGNOSIS — E7800 Pure hypercholesterolemia, unspecified: Secondary | ICD-10-CM

## 2015-06-26 DIAGNOSIS — N529 Male erectile dysfunction, unspecified: Secondary | ICD-10-CM | POA: Insufficient documentation

## 2015-06-26 NOTE — Progress Notes (Signed)
Patient ID: Larry Hernandez, male   DOB: 05/01/1946, 69 y.o.   MRN: [id number]    Subjective:  HPI  Hypertension, follow-up:  BP Readings from Last 3 Encounters:  06/26/15 132/70  12/20/14 138/88  06/11/15 134/74    He was last seen for hypertension 6 months ago.  BP at that visit was 134/74. Management since that visit includes none. He reports good compliance with treatment. He is not having side effects.  He is not exercising. He is adherent to low salt diet.   Outside blood pressures are not being checked. Patient denies chest pain, chest pressure/discomfort, dyspnea, exertional chest pressure/discomfort and fatigue.    Wt Readings from Last 3 Encounters:  06/26/15 213 lb (96.616 kg)  12/20/14 208 lb (94.348 kg)  06/11/15 225 lb (102.059 kg)    ------------------------------------------------------------------------  Pt Levothyroxine was decreased to 75 mcg daily on 12/20/14.     Prior to Admission medications   Medication Sig Start Date End Date Taking? Authorizing Provider  aspirin 325 MG tablet Take 325 mg by mouth 2 (two) times a week.    Yes Historical Provider, MD  levothyroxine (SYNTHROID, LEVOTHROID) 100 MCG tablet Take 75 mcg by mouth daily before breakfast.  05/30/13  Yes Historical Provider, MD  simvastatin (ZOCOR) 10 MG tablet Take 10 mg by mouth at bedtime.  06/10/13  Yes Historical Provider, MD  tadalafil (CIALIS) 20 MG tablet Take 20 mg by mouth daily as needed for erectile dysfunction.   Yes Historical Provider, MD    Patient Active Problem List   Diagnosis Date Noted  . Actinic keratosis 06/26/2015  . ED (erectile dysfunction) of organic origin 06/26/2015  . Hypercholesterolemia 06/26/2015  . Adiposity 11/17/2009  . Apnea, sleep 11/17/2009  . Urinary system disease 10/23/2009  . Adult hypothyroidism 10/23/2009  . BP (high blood pressure) 05/22/2009    Past Medical History  Diagnosis Date  . Hypertension   . Hypothyroid   . Cancer [facility])  2012    skin caner face and ear  . Hernia, inguinal     Social History   Social History  . Marital Status: Married    Spouse Name: N/A  . Number of Children: N/A  . Years of Education: N/A   Occupational History  . Not on file.   Social History Main Topics  . Smoking status: Never Smoker   . Smokeless tobacco: Never Used  . Alcohol Use: No  . Drug Use: No  . Sexual Activity: Not on file   Other Topics Concern  . Not on file   Social History Narrative    No Known Allergies  Review of Systems  Constitutional: Negative.   HENT: Negative.   Eyes: Negative.   Respiratory: Negative.   Cardiovascular: Negative.   Gastrointestinal: Negative.   Genitourinary: Negative.   Musculoskeletal: Negative.   Skin: Negative.   Neurological: Negative.   Endo/Heme/Allergies: Negative.   Psychiatric/Behavioral: Negative.     Immunization History  Administered Date(s) Administered  . Influenza-Unspecified 05/10/2015  . Pneumococcal Conjugate-13 12/20/2014  . Pneumococcal Polysaccharide-23 01/06/2012  . Tdap 04/19/2009  . Zoster 01/06/2012   Objective:  BP 132/70 mmHg  Pulse 64  Temp(Src) 97.6 F (36.4 C) (Oral)  Resp 16  Wt 213 lb (96.616 kg)  Physical Exam  Lab Results  Component Value Date   WBC 4.8 05/30/2014   HGB 14.6 12/20/2014   HCT 44 12/20/2014   PLT 204 12/20/2014   GLUCOSE 96 05/30/2014   CHOL 192 12/20/2014  TRIG 51 12/20/2014   HDL 36 12/20/2014   LDLCALC 146 12/20/2014   TSH 0.18* 12/20/2014   PSA 1.8 07/27/2014    CMP     Component Value Date/Time   NA 143 12/20/2014   NA 141 05/30/2014 1449   K 4.9 12/20/2014   K 4.2 05/30/2014 1449   CL 109* 05/30/2014 1449   CO2 28 05/30/2014 1449   GLUCOSE 96 05/30/2014 1449   BUN 22* 12/20/2014   BUN 22* 05/30/2014 1449   CREATININE 1.4* 12/20/2014   CREATININE 1.46* 05/30/2014 1449   CALCIUM 8.1* 05/30/2014 1449   AST 18 12/20/2014   ALT 11 12/20/2014   ALKPHOS 61 12/20/2014   GFRNONAA  49* 05/30/2014 1449   GFRAA 57* 05/30/2014 1449    Assessment and Plan :  1. Essential hypertension All issues controlled and stable.  2. Hypothyroidism, unspecified hypothyroidism type   3. Hypercholesterolemia 4.Hypertensive nephropathy I have done the exam and reviewed the above chart and it is accurate to the best of my knowledge.   [name] MD [facility] 06/26/2015 4:34 PM

## 2015-07-03 DIAGNOSIS — B079 Viral wart, unspecified: Secondary | ICD-10-CM | POA: Diagnosis not present

## 2015-07-03 DIAGNOSIS — Z08 Encounter for follow-up examination after completed treatment for malignant neoplasm: Secondary | ICD-10-CM | POA: Diagnosis not present

## 2015-07-03 DIAGNOSIS — L821 Other seborrheic keratosis: Secondary | ICD-10-CM | POA: Diagnosis not present

## 2015-07-03 DIAGNOSIS — Z85828 Personal history of other malignant neoplasm of skin: Secondary | ICD-10-CM | POA: Diagnosis not present

## 2015-07-03 DIAGNOSIS — D485 Neoplasm of uncertain behavior of skin: Secondary | ICD-10-CM | POA: Diagnosis not present

## 2015-12-25 ENCOUNTER — Ambulatory Visit: Payer: Medicare Other | Admitting: Family Medicine

## 2016-01-08 ENCOUNTER — Ambulatory Visit (INDEPENDENT_AMBULATORY_CARE_PROVIDER_SITE_OTHER): Payer: PPO | Admitting: Family Medicine

## 2016-01-08 VITALS — BP 148/64 | HR 60 | Temp 98.4°F | Resp 12 | Wt 212.0 lb

## 2016-01-08 DIAGNOSIS — I10 Essential (primary) hypertension: Secondary | ICD-10-CM | POA: Diagnosis not present

## 2016-01-08 DIAGNOSIS — E7800 Pure hypercholesterolemia, unspecified: Secondary | ICD-10-CM

## 2016-01-08 DIAGNOSIS — E039 Hypothyroidism, unspecified: Secondary | ICD-10-CM

## 2016-01-08 NOTE — Patient Instructions (Signed)
If you purchase a b/p machine to use at home [facility] and [employer] are good brand names to use.

## 2016-01-08 NOTE — Progress Notes (Signed)
Patient ID: Larry Hernandez, male   DOB: [birth year], 70 y.o.   MRN: [id number]    Subjective:  HPI  Patient is here for 6 months follow up. Hypertension: patient checks b/p occasionally and readings have been usually around 130/80s. No cardiac symptoms. BP Readings from Last 3 Encounters:  01/08/16 148/64  06/26/15 132/70  12/20/14 138/88   Hypothyroidism: Lab Results  Component Value Date   TSH 0.18* 12/20/2014   Hyperlipidemia: Patient is taking Simvastatin and  Lab Results  Component Value Date   CHOL 192 12/20/2014   HDL 36 12/20/2014   LDLCALC 146 12/20/2014   TRIG 51 12/20/2014   Per our records. Patient states he had labs done for his insurance and he was advised to get [place] records of that, he thinks lipid panel was checked then.  Prior to Admission medications   Medication Sig Start Date End Date Taking? Authorizing Provider  aspirin 325 MG tablet Take 325 mg by mouth 2 (two) times a week.    Yes Historical Provider, MD  levothyroxine (SYNTHROID, LEVOTHROID) 100 MCG tablet Take 75 mcg by mouth daily before breakfast.  05/30/13  Yes Historical Provider, MD  simvastatin (ZOCOR) 10 MG tablet Take 10 mg by mouth at bedtime.  06/10/13  Yes Historical Provider, MD  tadalafil (CIALIS) 20 MG tablet Take 20 mg by mouth daily as needed for erectile dysfunction.   Yes Historical Provider, MD    Patient Active Problem List   Diagnosis Date Noted  . Actinic keratosis 06/26/2015  . ED (erectile dysfunction) of organic origin 06/26/2015  . Hypercholesterolemia 06/26/2015  . Adiposity 11/17/2009  . Apnea, sleep 11/17/2009  . Urinary system disease 10/23/2009  . Adult hypothyroidism 10/23/2009  . BP (high blood pressure) 05/22/2009    Past Medical History  Diagnosis Date  . Hypertension   . Hypothyroid   . Cancer [facility]) 2012    skin caner face and ear  . Hernia, inguinal     Social History   Social History  . Marital Status: Married    Spouse Name: N/A  . Number of  Children: N/A  . Years of Education: N/A   Occupational History  . Not on file.   Social History Main Topics  . Smoking status: Never Smoker   . Smokeless tobacco: Never Used  . Alcohol Use: No  . Drug Use: No  . Sexual Activity: Not on file   Other Topics Concern  . Not on file   Social History Narrative    No Known Allergies  Review of Systems  Constitutional: Negative.   Eyes: Negative.   Respiratory: Negative.   Cardiovascular: Negative.   Gastrointestinal: Negative.   Musculoskeletal: Negative.   Skin: Negative.   Endo/Heme/Allergies: Negative.   Psychiatric/Behavioral: Negative.     Immunization History  Administered Date(s) Administered  . Influenza-Unspecified 05/10/2015  . Pneumococcal Conjugate-13 12/20/2014  . Pneumococcal Polysaccharide-23 01/06/2012  . Tdap 04/19/2009  . Zoster 01/06/2012   Objective:  BP 148/64 mmHg  Pulse 60  Temp(Src) 98.4 F (36.9 C)  Resp 12  Wt 212 lb (96.163 kg)  Physical Exam  Constitutional: He is oriented to person, place, and time and well-developed, well-nourished, and in no distress.  HENT:  Head: Normocephalic and atraumatic.  Eyes: Conjunctivae are normal. Pupils are equal, round, and reactive to light.  Neck: Normal range of motion.  Cardiovascular: Normal rate, regular rhythm, normal heart sounds and intact distal pulses.   No murmur heard. Pulmonary/Chest: Effort normal and breath  sounds normal. No respiratory distress.  Musculoskeletal: He exhibits no edema or tenderness.  Neurological: He is alert and oriented to person, place, and time.    Lab Results  Component Value Date   WBC 4.8 05/30/2014   HGB 14.6 12/20/2014   HCT 44 12/20/2014   PLT 204 12/20/2014   GLUCOSE 96 05/30/2014   CHOL 192 12/20/2014   TRIG 51 12/20/2014   HDL 36 12/20/2014   LDLCALC 146 12/20/2014   TSH 0.18* 12/20/2014   PSA 1.8 07/27/2014    CMP     Component Value Date/Time   NA 143 12/20/2014   NA 141 05/30/2014  1449   K 4.9 12/20/2014   K 4.2 05/30/2014 1449   CL 109* 05/30/2014 1449   CO2 28 05/30/2014 1449   GLUCOSE 96 05/30/2014 1449   BUN 22* 12/20/2014   BUN 22* 05/30/2014 1449   CREATININE 1.4* 12/20/2014   CREATININE 1.46* 05/30/2014 1449   CALCIUM 8.1* 05/30/2014 1449   AST 18 12/20/2014   ALT 11 12/20/2014   ALKPHOS 61 12/20/2014   GFRNONAA 49* 05/30/2014 1449   GFRAA 57* 05/30/2014 1449    Assessment and Plan :  1. Essential hypertension Slightly elevated today, advised patient to continue checking his b/p, discussed starting medication but will wait till next visit and readdress.  2. Hypothyroidism, unspecified hypothyroidism type Re check level today, last level was borderline. - TSH  3. Hypercholesterolemia Patient had labs done through insurance about 2 months ago and patient will get [place] copies of the results and will re evaluate after looking at the results. I have done the exam and reviewed the above chart and it is accurate to the best of my knowledge.  Patient was seen and examined by Dr. Eulas Post and note was scribed by Theressa Millard, RMA.    [name] MD [facility] 01/08/2016 3:57 PM

## 2016-01-09 LAB — TSH: TSH: 3.32 u[IU]/mL (ref 0.450–4.500)

## 2016-02-05 ENCOUNTER — Other Ambulatory Visit: Payer: Self-pay | Admitting: Family Medicine

## 2016-04-23 ENCOUNTER — Encounter: Payer: Self-pay | Admitting: *Deleted

## 2016-04-23 ENCOUNTER — Other Ambulatory Visit: Payer: Self-pay | Admitting: General Surgery

## 2016-04-23 DIAGNOSIS — I714 Abdominal aortic aneurysm, without rupture, unspecified: Secondary | ICD-10-CM

## 2016-04-23 DIAGNOSIS — R0989 Other specified symptoms and signs involving the circulatory and respiratory systems: Secondary | ICD-10-CM

## 2016-05-22 ENCOUNTER — Ambulatory Visit: Payer: PPO | Admitting: Family Medicine

## 2016-06-06 ENCOUNTER — Ambulatory Visit
Admission: RE | Admit: 2016-06-06 | Discharge: 2016-06-06 | Disposition: A | Discharge status: Home / Self Care | Payer: PPO | Source: Ambulatory Visit | Attending: General Surgery | Admitting: General Surgery

## 2016-06-06 DIAGNOSIS — I714 Abdominal aortic aneurysm, without rupture, unspecified: Secondary | ICD-10-CM

## 2016-06-06 DIAGNOSIS — I723 Aneurysm of iliac artery: Secondary | ICD-10-CM | POA: Insufficient documentation

## 2016-06-09 ENCOUNTER — Ambulatory Visit (INDEPENDENT_AMBULATORY_CARE_PROVIDER_SITE_OTHER): Payer: PPO | Admitting: Family Medicine

## 2016-06-09 VITALS — BP 118/82 | HR 84 | Temp 97.4°F | Resp 16 | Wt 219.0 lb

## 2016-06-09 DIAGNOSIS — E039 Hypothyroidism, unspecified: Secondary | ICD-10-CM | POA: Diagnosis not present

## 2016-06-09 DIAGNOSIS — I10 Essential (primary) hypertension: Secondary | ICD-10-CM | POA: Diagnosis not present

## 2016-06-09 DIAGNOSIS — E7800 Pure hypercholesterolemia, unspecified: Secondary | ICD-10-CM

## 2016-06-09 DIAGNOSIS — Z23 Encounter for immunization: Secondary | ICD-10-CM

## 2016-06-09 MED ORDER — LEVOTHYROXINE SODIUM 75 MCG PO TABS: 75.0000 ug | ORAL_TABLET | Freq: Every day | ORAL | 3 refills | Status: DC

## 2016-06-09 MED ORDER — LOSARTAN POTASSIUM 50 MG PO TABS: 50.0000 mg | ORAL_TABLET | Freq: Every day | ORAL | 3 refills | Status: DC

## 2016-06-09 MED ORDER — SIMVASTATIN 10 MG PO TABS: 10.0000 mg | ORAL_TABLET | Freq: Every day | ORAL | 3 refills | Status: DC

## 2016-06-09 NOTE — Progress Notes (Signed)
Larry Hernandez  MRN: [id number] DOB: 1946-03-02  Subjective:  HPI   The patient is a 70 year old male who presents for follow up of his blood pressure.  He was last seen on 01/08/16 and his pressure at that time was 148/64.  He has a report of his home readings which were all much higher than we got today in the office.  He is not currently on any medication for his blood pressure.    The patient is also due to have his cholesterol checked today.  The patient is fasting.  Patient Active Problem List   Diagnosis Date Noted  . Actinic keratosis 06/26/2015  . ED (erectile dysfunction) of organic origin 06/26/2015  . Hypercholesterolemia 06/26/2015  . Adiposity 11/17/2009  . Apnea, sleep 11/17/2009  . Urinary system disease 10/23/2009  . Adult hypothyroidism 10/23/2009  . BP (high blood pressure) 05/22/2009    Past Medical History:  Diagnosis Date  . Cancer [facility]) 2012   skin caner face and ear  . Hernia, inguinal   . Hypertension   . Hypothyroid     Social History   Social History  . Marital status: Married    Spouse name: N/A  . Number of children: N/A  . Years of education: N/A   Occupational History  . Not on file.   Social History Main Topics  . Smoking status: Never Smoker  . Smokeless tobacco: Never Used  . Alcohol use No  . Drug use: No  . Sexual activity: Not on file   Other Topics Concern  . Not on file   Social History Narrative  . No narrative on file    Outpatient Encounter Prescriptions as of 06/09/2016  Medication Sig  . aspirin 325 MG tablet Take 325 mg by mouth 2 (two) times a week.   . levothyroxine (SYNTHROID, LEVOTHROID) 75 MCG tablet TAKE 1 TABLET BY MOUTH DAILY  . simvastatin (ZOCOR) 10 MG tablet TAKE 1 TABLET BY MOUTH AT BEDTIME  . tadalafil (CIALIS) 20 MG tablet Take 20 mg by mouth daily as needed for erectile dysfunction.  . [DISCONTINUED] levothyroxine (SYNTHROID, LEVOTHROID) 100 MCG tablet Take 75 mcg by mouth daily before  breakfast.    No facility-administered encounter medications on file as of 06/09/2016.     No Known Allergies  Review of Systems  Constitutional: Negative for fever and malaise/fatigue.  Eyes: Negative.   Respiratory: Negative for cough, shortness of breath and wheezing.   Cardiovascular: Negative for chest pain, palpitations, orthopnea, claudication, leg swelling and PND.  Gastrointestinal: Negative.   Neurological: Negative for dizziness, weakness and headaches.  Endo/Heme/Allergies: Negative.   Psychiatric/Behavioral: Negative.    Objective:  BP 118/82   Pulse 84   Temp 97.4 F (36.3 C) (Oral)   Resp 16   Wt 219 lb (99.3 kg)   BMI 31.42 kg/m   Physical Exam  Constitutional: He is oriented to person, place, and time.  HENT:  Head: Normocephalic and atraumatic.  Right Ear: External ear normal.  Left Ear: External ear normal.  Nose: Nose normal.  Eyes: Conjunctivae are normal. No scleral icterus.  Neck: No thyromegaly present.  Cardiovascular: Normal rate, regular rhythm and normal heart sounds.   Pulmonary/Chest: Effort normal and breath sounds normal.  Abdominal: Soft.  Neurological: He is alert and oriented to person, place, and time.  Skin: Skin is warm and dry.  Psychiatric: Mood, memory, affect and judgment normal.    Assessment and Plan :  1. Hypercholesterolemia  -  simvastatin (ZOCOR) 10 MG tablet; Take 1 tablet (10 mg total) by mouth at bedtime.  Dispense: 90 tablet; Refill: 3 - Lipid Panel With LDL/HDL Ratio  2. Adult hypothyroidism  - levothyroxine (SYNTHROID, LEVOTHROID) 75 MCG tablet; Take 1 tablet (75 mcg total) by mouth daily.  Dispense: 90 tablet; Refill: 3  3. Essential hypertension  - losartan (COZAAR) 50 MG tablet; Take 1 tablet (50 mg total) by mouth daily.  Dispense: 90 tablet; Refill: 3 - CBC with Differential/Platelet - Comprehensive metabolic panel  4. Need for influenza vaccination  - Flu vaccine HIGH DOSE PF (Fluzone High  dose)     HPI, Exam and A&P Transcribed under the direction and in the presence of Wilhemena Durie., MD. Electronically Signed: Althea Charon, RMA I have done the exam and reviewed the chart and it is accurate to the best of my knowledge. [name] M.D. [facility]

## 2016-06-10 LAB — CBC WITH DIFFERENTIAL/PLATELET
BASOS ABS: 0 10*3/uL (ref 0.0–0.2)
BASOS: 0 %
EOS (ABSOLUTE): 0.2 10*3/uL (ref 0.0–0.4)
Eos: 4 %
HEMOGLOBIN: 15.3 g/dL (ref 12.6–17.7)
Hematocrit: 44.6 % (ref 37.5–51.0)
IMMATURE GRANS (ABS): 0 10*3/uL (ref 0.0–0.1)
Immature Granulocytes: 1 %
LYMPHS: 30 %
Lymphocytes Absolute: 1.7 10*3/uL (ref 0.7–3.1)
MCH: 31.4 pg (ref 26.6–33.0)
MCHC: 34.3 g/dL (ref 31.5–35.7)
MCV: 92 fL (ref 79–97)
MONOCYTES: 7 %
Monocytes Absolute: 0.4 10*3/uL (ref 0.1–0.9)
NEUTROS ABS: 3.4 10*3/uL (ref 1.4–7.0)
Neutrophils: 58 %
Platelets: 178 10*3/uL (ref 150–379)
RBC: 4.87 x10E6/uL (ref 4.14–5.80)
RDW: 13.9 % (ref 12.3–15.4)
WBC: 5.7 10*3/uL (ref 3.4–10.8)

## 2016-06-10 LAB — COMPREHENSIVE METABOLIC PANEL
ALK PHOS: 71 IU/L (ref 39–117)
ALT: 13 IU/L (ref 0–44)
AST: 17 IU/L (ref 0–40)
Albumin/Globulin Ratio: 1.4 (ref 1.2–2.2)
Albumin: 4.3 g/dL (ref 3.6–4.8)
BILIRUBIN TOTAL: 0.9 mg/dL (ref 0.0–1.2)
BUN/Creatinine Ratio: 10 (ref 10–24)
BUN: 17 mg/dL (ref 8–27)
CHLORIDE: 105 mmol/L (ref 96–106)
CO2: 23 mmol/L (ref 18–29)
CREATININE: 1.69 mg/dL — AB (ref 0.76–1.27)
Calcium: 9.2 mg/dL (ref 8.6–10.2)
GFR calc Af Amer: 47 mL/min/{1.73_m2} — ABNORMAL LOW (ref >59)
GFR calc non Af Amer: 41 mL/min/{1.73_m2} — ABNORMAL LOW (ref >59)
GLUCOSE: 100 mg/dL — AB (ref 65–99)
Globulin, Total: 3 g/dL (ref 1.5–4.5)
Potassium: 5.1 mmol/L (ref 3.5–5.2)
Sodium: 145 mmol/L — ABNORMAL HIGH (ref 134–144)
TOTAL PROTEIN: 7.3 g/dL (ref 6.0–8.5)

## 2016-06-10 LAB — LIPID PANEL WITH LDL/HDL RATIO
CHOLESTEROL TOTAL: 188 mg/dL (ref 100–199)
HDL: 27 mg/dL — AB (ref >39)
LDL CALC: 136 mg/dL — AB (ref 0–99)
LDl/HDL Ratio: 5 ratio units — ABNORMAL HIGH (ref 0.0–3.6)
Triglycerides: 124 mg/dL (ref 0–149)
VLDL CHOLESTEROL CAL: 25 mg/dL (ref 5–40)

## 2016-06-18 ENCOUNTER — Ambulatory Visit (INDEPENDENT_AMBULATORY_CARE_PROVIDER_SITE_OTHER): Payer: PPO | Admitting: General Surgery

## 2016-06-18 ENCOUNTER — Encounter: Payer: Self-pay | Admitting: General Surgery

## 2016-06-18 VITALS — BP 132/66 | HR 48 | Resp 14 | Ht 70.0 in | Wt 222.0 lb

## 2016-06-18 DIAGNOSIS — I714 Abdominal aortic aneurysm, without rupture, unspecified: Secondary | ICD-10-CM

## 2016-06-18 NOTE — Patient Instructions (Signed)
Return in 2 years with follow-up ultrasound of abdominal aorta.

## 2016-06-18 NOTE — Progress Notes (Signed)
Patient ID: Larry Hernandez, male   DOB: Jul 09, 1946, 70 y.o.   MRN: [id number]  Chief Complaint  Patient presents with  . Follow-up    ultrasound    HPI Larry Hernandez is a 70 y.o. male here today for his one year follow up for abdominal aortic aneurysm. Ultrasound was done on 06/06/16. Patient states he is doing well. No complaints.  I have reviewed the history of present illness with the patient.  HPI  Past Medical History:  Diagnosis Date  . Cancer [facility]) 2012   skin caner face and ear  . Hernia, inguinal   . Hypertension   . Hypothyroid     Past Surgical History:  Procedure Laterality Date  . COLONOSCOPY  2012  . HERNIA REPAIR Left 06-13-14   inguinal    No family history on file.  Social History Social History  Substance Use Topics  . Smoking status: Never Smoker  . Smokeless tobacco: Never Used  . Alcohol use No    No Known Allergies  Current Outpatient Prescriptions  Medication Sig Dispense Refill  . aspirin 325 MG tablet Take 325 mg by mouth 2 (two) times a week.     . levothyroxine (SYNTHROID, LEVOTHROID) 75 MCG tablet Take 1 tablet (75 mcg total) by mouth daily. 90 tablet 3  . losartan (COZAAR) 50 MG tablet Take 1 tablet (50 mg total) by mouth daily. 90 tablet 3  . simvastatin (ZOCOR) 10 MG tablet Take 1 tablet (10 mg total) by mouth at bedtime. 90 tablet 3  . tadalafil (CIALIS) 20 MG tablet Take 20 mg by mouth daily as needed for erectile dysfunction.     No current facility-administered medications for this visit.     Review of Systems Review of Systems  Constitutional: Negative.   Respiratory: Negative.   Cardiovascular: Negative.   Gastrointestinal: Negative.   Genitourinary: Negative.   Musculoskeletal: Negative.     Blood pressure 132/66, pulse (!) 48, resp. rate 14, height 5\' 10"  (1.778 m), weight 222 lb (100.7 kg).  Physical Exam Physical Exam  Constitutional: He is oriented to person, place, and time. He appears well-developed and  well-nourished.  Eyes: Conjunctivae are normal. No scleral icterus.  Neck: Neck supple.  Cardiovascular: Normal rate, regular rhythm and normal heart sounds.   Pulses:      Carotid pulses are 2+ on the right side, and 2+ on the left side.      Femoral pulses are 2+ on the right side, and 2+ on the left side.      Dorsalis pedis pulses are 2+ on the right side, and 2+ on the left side.       Posterior tibial pulses are 2+ on the right side, and 2+ on the left side.  Feet are warm, capillary refill is brisk, no venous stasis changes  Pulmonary/Chest: Effort normal and breath sounds normal.  Abdominal: Soft. Bowel sounds are normal. There is no tenderness.  No bruits auscultated over the aorta  Neurological: He is alert and oriented to person, place, and time.  Skin: Skin is warm and dry.    Data Reviewed ultrasound reviewed   Assessment    Abdominal aortic aneurysm was 3.5 cm on [place] from 1 year ago, now 3.7 cm.  No concern for rupture at this time.  Plan       Return in 2 years with follow-up ultrasound of abdominal aorta to reassess aneurysm size.       SANKAR,SEEPLAPUTHUR G 06/18/2016, 2:48 PM

## 2016-07-10 IMAGING — US US RETROPERITONEAL COMPLETE
1 series · 13 of 25 positions shown · non-contrast
Comparison: 05/29/2014

CLINICAL DATA: Followup a ectasia of the abdominal aorta.

EXAM:
ULTRASOUND RETROPERITONEAL COMPLETE
TECHNIQUE: Ultrasound examination of the abdominal aorta was performed to
evaluate for abdominal aortic aneurysm. The common iliac arteries,
IVC, and kidneys were also evaluated.

[Series 1: us retroperitoneal complete · 0.26mm/px · 13 of 72 slices shown]
[im 1/72]
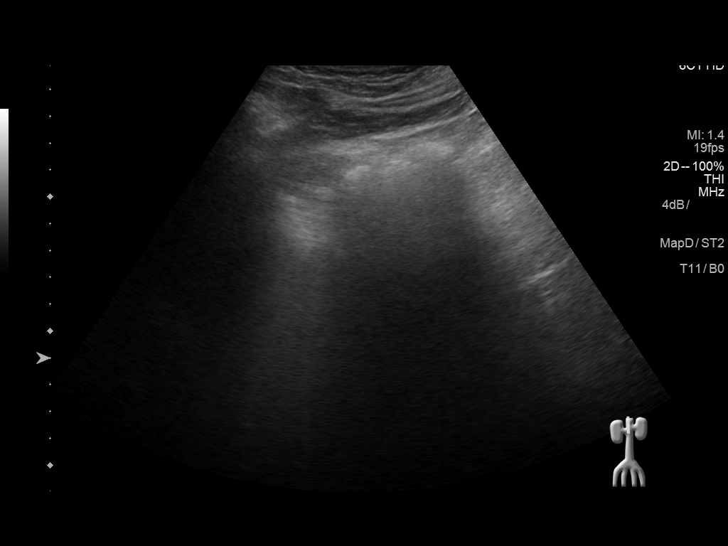
[im 6/72]
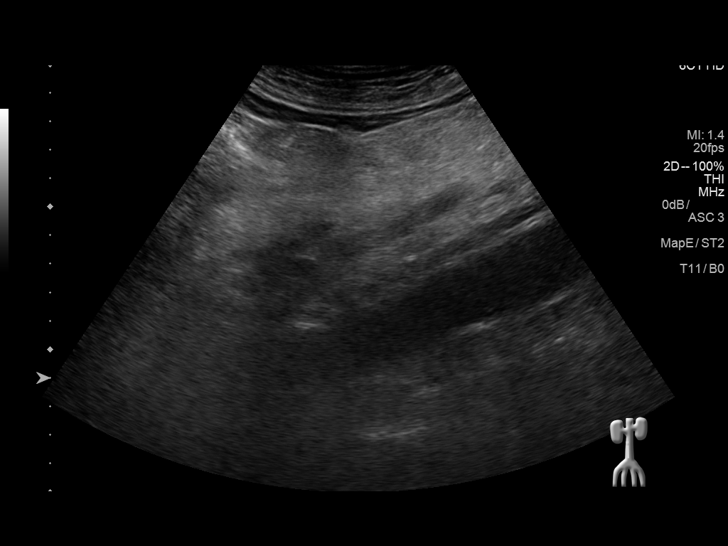
[im 12/72]
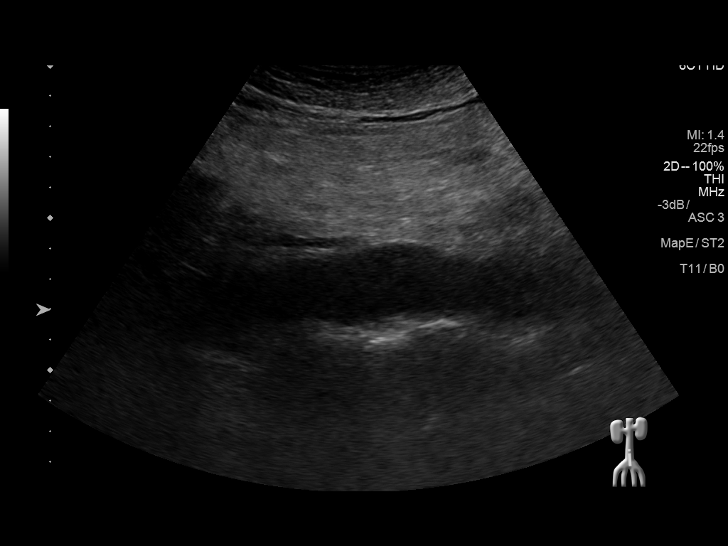
[im 18/72]
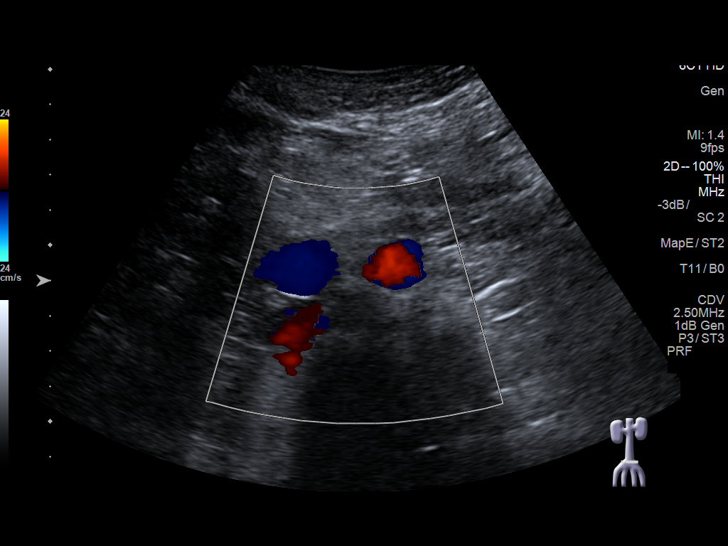
[im 24/72]
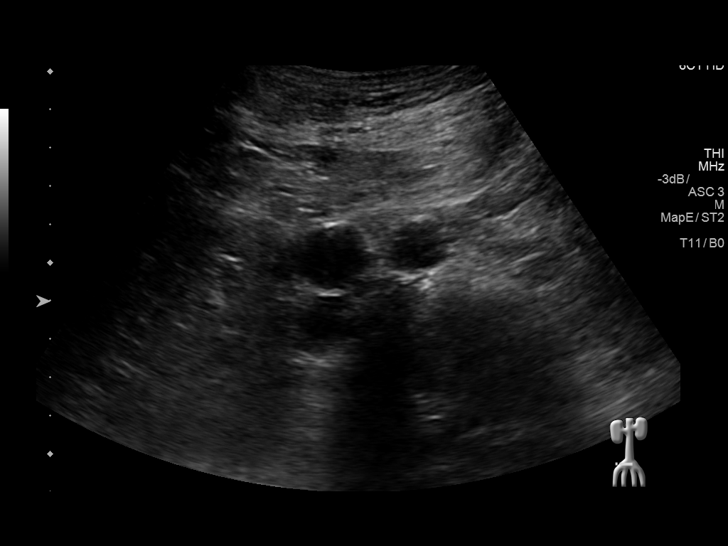
[im 30/72]
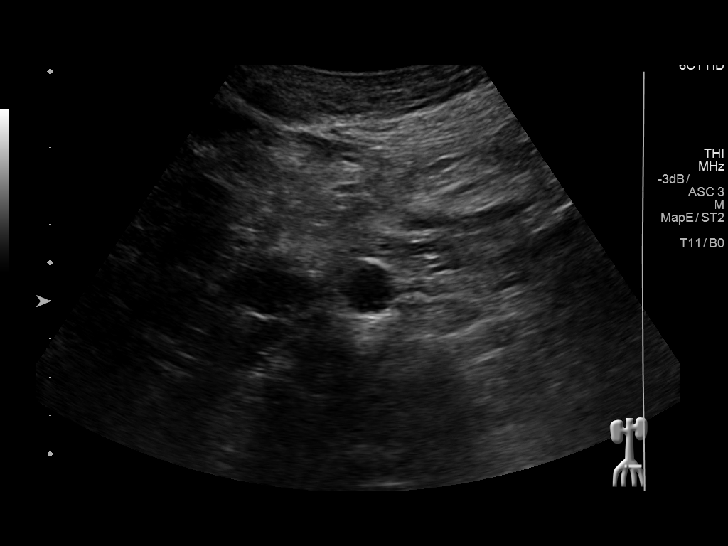
[im 36/72]
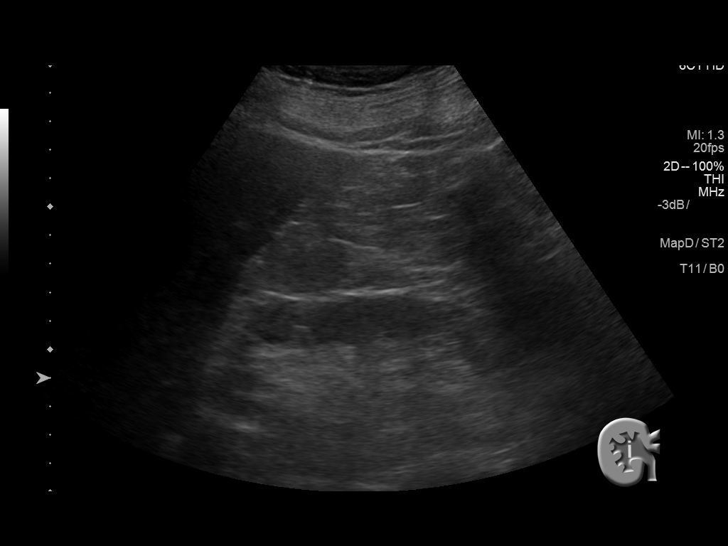
[im 42/72]
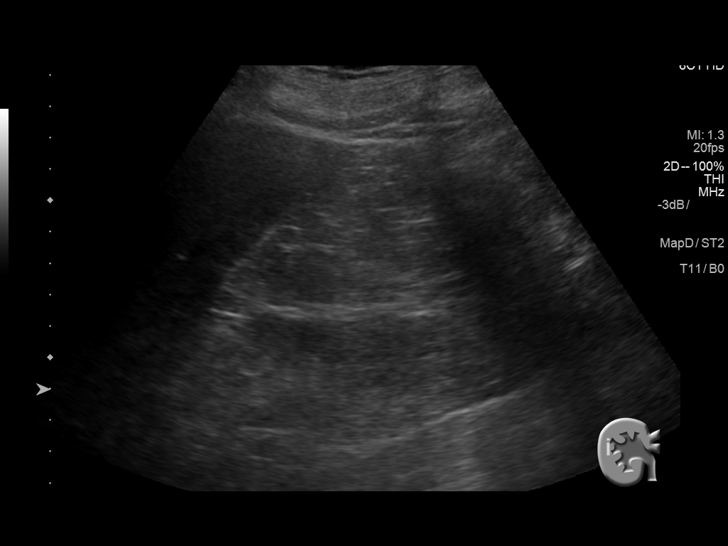
[im 48/72]
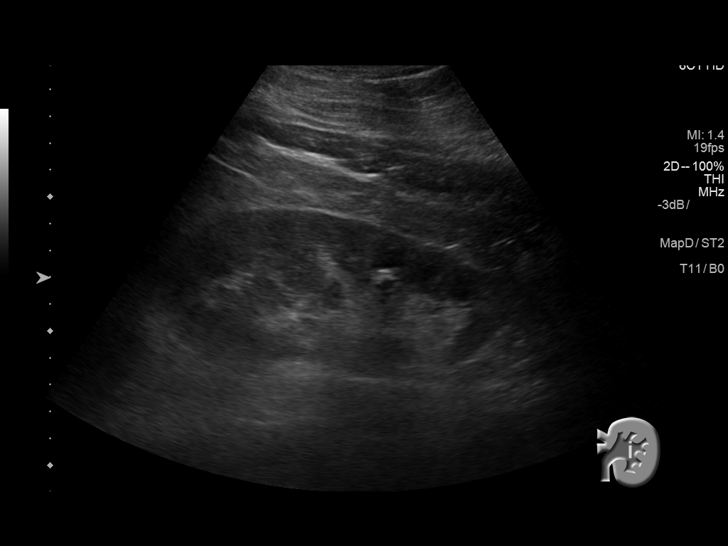
[im 54/72]
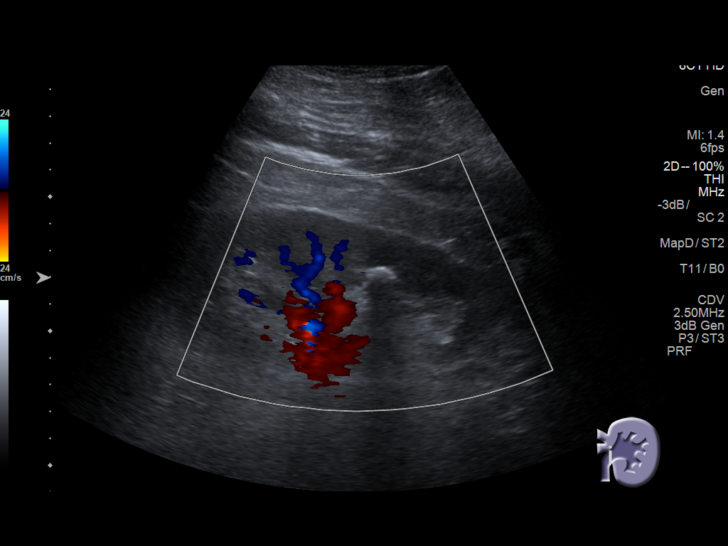
[im 60/72]
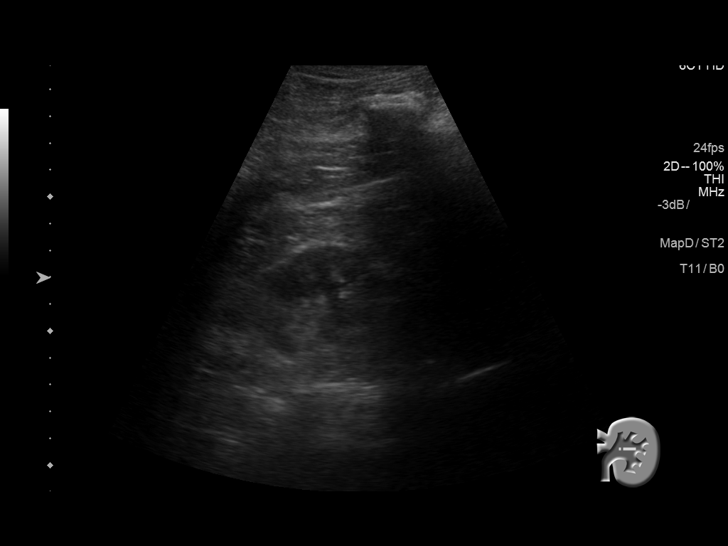
[im 66/72]
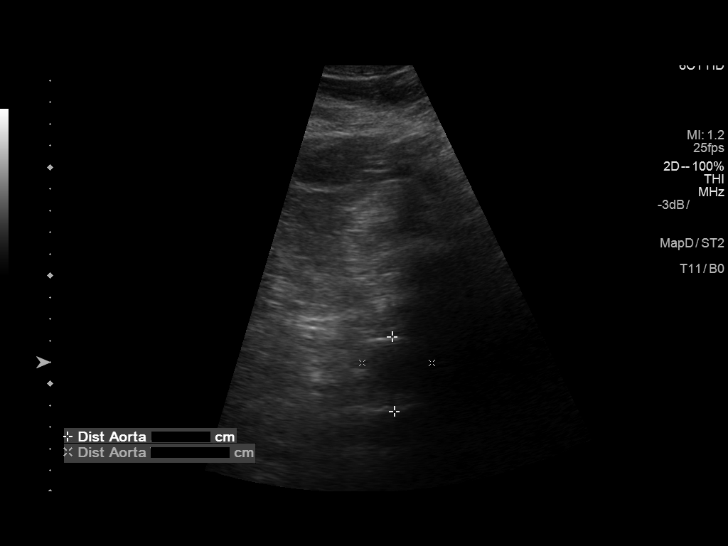
[im 72/72]
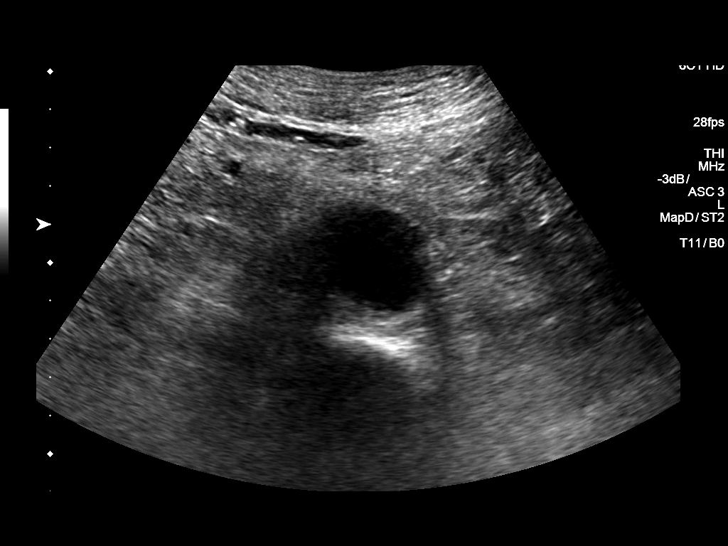

[13 of 25 positions shown; findings below may reference images not displayed]

FINDINGS: Abdominal Aorta

Aortic remains ectatic. There is fusiform dilation in the infrarenal
abdominal aorta consistent with an aneurysm.

Maximum AP

Diameter:  3.6 cm distally.

Maximum TRV

Diameter: 3.2 cm distally.

Right Common Iliac Artery

1.9 cm

Left Common Iliac Artery

1.6 cm

IVC

No abnormality visualized.

Right Kidney

Length: 9.6 cm borderline increased parenchymal echogenicity. Renal
cortical thinning noted mostly in the upper pole. No mass or
hydronephrosis.

Left Kidney

Length: 13.3 cm. Normal parenchymal echogenicity. 9 mm
nonobstructing stone in the lower pole. No hydronephrosis.
IMPRESSION: 1. There has been increase in the size of the infrarenal abdominal
aorta. It now measures 3.5 cm in greatest anterior-posterior
dimension where there is fusiform dilation consistent with an
aneurysm. Recommend followup by ultrasound in 2 years. This
recommendation follows ACR consensus guidelines: White Paper of the
ACR Incidental Findings Committee II on Vascular Findings. [HOSPITAL] 5138; [DATE].
2. Dilated common iliac arteries, right greater than left, similar
to the prior exam.
3. 9 mm nonobstructing stone in the left kidney, stable from the
prior study.

## 2016-07-14 ENCOUNTER — Ambulatory Visit (INDEPENDENT_AMBULATORY_CARE_PROVIDER_SITE_OTHER): Payer: PPO | Admitting: Family Medicine

## 2016-07-14 VITALS — BP 154/78 | HR 56 | Temp 98.6°F | Resp 16 | Wt 221.0 lb

## 2016-07-14 DIAGNOSIS — E039 Hypothyroidism, unspecified: Secondary | ICD-10-CM | POA: Diagnosis not present

## 2016-07-14 DIAGNOSIS — I10 Essential (primary) hypertension: Secondary | ICD-10-CM | POA: Diagnosis not present

## 2016-07-14 DIAGNOSIS — E7800 Pure hypercholesterolemia, unspecified: Secondary | ICD-10-CM

## 2016-07-14 DIAGNOSIS — N289 Disorder of kidney and ureter, unspecified: Secondary | ICD-10-CM | POA: Diagnosis not present

## 2016-07-14 MED ORDER — AMLODIPINE BESYLATE 5 MG PO TABS: 5.0000 mg | ORAL_TABLET | Freq: Every day | ORAL | 3 refills | Status: DC

## 2016-07-14 NOTE — Progress Notes (Signed)
Larry Hernandez  MRN: [id number] DOB: 28-Aug-1946  Subjective:  HPI  Patient is here for 1 month follow up on blood pressure. He has been checking his b/p at [place] and readings have been mostly on the higher side and he is not sure how accurate the machine is there because his b/p has been good the last 2 times he has seen a doctor compared to the readings he has been getting. Average readings 160s-180s/80s-100. No cardiac symptoms. He was started on Losartan 50 mg on the last visit.  BP Readings from Last 3 Encounters:  07/14/16 (!) 154/78  06/18/16 132/66  06/09/16 118/82   Patient Active Problem List   Diagnosis Date Noted  . Actinic keratosis 06/26/2015  . ED (erectile dysfunction) of organic origin 06/26/2015  . Hypercholesterolemia 06/26/2015  . Adiposity 11/17/2009  . Apnea, sleep 11/17/2009  . Urinary system disease 10/23/2009  . Adult hypothyroidism 10/23/2009  . Hypertension 05/22/2009    Past Medical History:  Diagnosis Date  . Cancer [facility]) 2012   skin caner face and ear  . Hernia, inguinal   . Hypertension   . Hypothyroid     Social History   Social History  . Marital status: Married    Spouse name: N/A  . Number of children: N/A  . Years of education: N/A   Occupational History  . Not on file.   Social History Main Topics  . Smoking status: Never Smoker  . Smokeless tobacco: Never Used  . Alcohol use No  . Drug use: No  . Sexual activity: Not on file   Other Topics Concern  . Not on file   Social History Narrative  . No narrative on file    Outpatient Encounter Prescriptions as of 07/14/2016  Medication Sig  . aspirin 325 MG tablet Take 325 mg by mouth 2 (two) times a week.   . levothyroxine (SYNTHROID, LEVOTHROID) 75 MCG tablet Take 1 tablet (75 mcg total) by mouth daily.  [name] losartan (COZAAR) 50 MG tablet Take 1 tablet (50 mg total) by mouth daily.  . simvastatin (ZOCOR) 10 MG tablet Take 1 tablet (10 mg total) by mouth at bedtime.  .  tadalafil (CIALIS) 20 MG tablet Take 20 mg by mouth daily as needed for erectile dysfunction.   No facility-administered encounter medications on file as of 07/14/2016.     No Known Allergies  Review of Systems  Constitutional: Negative.   Eyes: Negative.   Respiratory: Negative.   Cardiovascular: Negative.   Gastrointestinal: Negative.   Musculoskeletal: Negative.   Skin: Negative.   Neurological: Negative.   Endo/Heme/Allergies: Negative.   Psychiatric/Behavioral: Negative.     Objective:  BP (!) 154/78   Pulse (!) 56   Temp 98.6 F (37 C)   Resp 16   Wt 221 lb (100.2 kg)   BMI 31.71 kg/m   Physical Exam  Constitutional: He is oriented to person, place, and time and well-developed, well-nourished, and in no distress.  HENT:  Head: Normocephalic and atraumatic.  Right Ear: External ear normal.  Left Ear: External ear normal.  Nose: Nose normal.  Eyes: Conjunctivae are normal. Pupils are equal, round, and reactive to light.  Neck: Normal range of motion. Neck supple.  Cardiovascular: Normal rate, regular rhythm, normal heart sounds and intact distal pulses.   No murmur heard. Pulmonary/Chest: Effort normal and breath sounds normal. No respiratory distress. He has no wheezes.  Abdominal: Soft.  Musculoskeletal: He exhibits no edema or tenderness.  Neurological:  He is alert and oriented to person, place, and time.  Skin: Skin is warm and dry.  Psychiatric: Mood, memory, affect and judgment normal.    Assessment and Plan :  1. Essential hypertension Not much improvement. Will switch Losartan to Amlodipine and re check on the next visit. Advised patient that he may develop edema with the new medication.  2. Adult hypothyroidism  3. Hypercholesterolemia 4. Hypertensive nephropathy Kidney function has decreased from a year ago, will re check level today and may need further treatment if levels are worsening. Possibly may need to add back Losartan. Pending  results. HPI, Exam and A&P transcribed under direction and in the presence of [name], MD. I have done the exam and reviewed the chart and it is accurate to the best of my knowledge. [name] M.D. [facility]

## 2016-07-15 LAB — RENAL FUNCTION PANEL
Albumin: 4 g/dL (ref 3.6–4.8)
BUN / CREAT RATIO: 10 (ref 10–24)
BUN: 16 mg/dL (ref 8–27)
CO2: 25 mmol/L (ref 18–29)
CREATININE: 1.54 mg/dL — AB (ref 0.76–1.27)
Calcium: 8.9 mg/dL (ref 8.6–10.2)
Chloride: 103 mmol/L (ref 96–106)
GFR, EST AFRICAN AMERICAN: 52 mL/min/{1.73_m2} — AB (ref >59)
GFR, EST NON AFRICAN AMERICAN: 45 mL/min/{1.73_m2} — AB (ref >59)
Glucose: 99 mg/dL (ref 65–99)
Phosphorus: 2.5 mg/dL (ref 2.5–4.5)
Potassium: 5.3 mmol/L — ABNORMAL HIGH (ref 3.5–5.2)
SODIUM: 142 mmol/L (ref 134–144)

## 2016-08-14 ENCOUNTER — Ambulatory Visit (INDEPENDENT_AMBULATORY_CARE_PROVIDER_SITE_OTHER): Payer: PPO | Admitting: Family Medicine

## 2016-08-14 VITALS — BP 152/68 | HR 54 | Temp 97.8°F | Resp 14 | Wt 220.0 lb

## 2016-08-14 DIAGNOSIS — E039 Hypothyroidism, unspecified: Secondary | ICD-10-CM | POA: Diagnosis not present

## 2016-08-14 DIAGNOSIS — I10 Essential (primary) hypertension: Secondary | ICD-10-CM | POA: Diagnosis not present

## 2016-08-14 MED ORDER — AMLODIPINE BESYLATE 10 MG PO TABS: 10.0000 mg | ORAL_TABLET | Freq: Every day | ORAL | 3 refills | Status: DC

## 2016-08-14 NOTE — Progress Notes (Signed)
Larry Hernandez  MRN: [id number] DOB: [birth year]  Subjective:  HPI  Patient is here for 1 month follow up on blood pressure. On his last visit in November Losartan was switched to Amlodipine to get better control of the blood pressure. Patient feels like readings are getting a little better when he checks it and he has a graph with him today of his numbers. He has maybe slight edema from Amlodipine but states nothing that is noticeable at this time or severe. BP Readings from Last 3 Encounters:  08/14/16 (!) 152/68  07/14/16 (!) 154/78  06/18/16 132/66   Patient Active Problem List   Diagnosis Date Noted  . Actinic keratosis 06/26/2015  . ED (erectile dysfunction) of organic origin 06/26/2015  . Hypercholesterolemia 06/26/2015  . Adiposity 11/17/2009  . Apnea, sleep 11/17/2009  . Urinary system disease 10/23/2009  . Adult hypothyroidism 10/23/2009  . Hypertension 05/22/2009    Past Medical History:  Diagnosis Date  . Cancer [facility]) 2012   skin caner face and ear  . Hernia, inguinal   . Hypertension   . Hypothyroid     Social History   Social History  . Marital status: Married    Spouse name: N/A  . Number of children: N/A  . Years of education: N/A   Occupational History  . Not on file.   Social History Main Topics  . Smoking status: Never Smoker  . Smokeless tobacco: Never Used  . Alcohol use No  . Drug use: No  . Sexual activity: Not on file   Other Topics Concern  . Not on file   Social History Narrative  . No narrative on file    Outpatient Encounter Prescriptions as of 08/14/2016  Medication Sig  . amLODipine (NORVASC) 5 MG tablet Take 1 tablet (5 mg total) by mouth daily.  [name] aspirin 325 MG tablet Take 325 mg by mouth 2 (two) times a week.   . levothyroxine (SYNTHROID, LEVOTHROID) 75 MCG tablet Take 1 tablet (75 mcg total) by mouth daily.  . simvastatin (ZOCOR) 10 MG tablet Take 1 tablet (10 mg total) by mouth at bedtime.  . tadalafil (CIALIS) 20  MG tablet Take 20 mg by mouth daily as needed for erectile dysfunction.   No facility-administered encounter medications on file as of 08/14/2016.     No Known Allergies  Review of Systems  Constitutional: Negative.   Respiratory: Negative.   Cardiovascular: Positive for leg swelling (very mild per patient).  Gastrointestinal: Negative.   Musculoskeletal: Negative.   Neurological: Negative.   Endo/Heme/Allergies: Negative.     Objective:  BP (!) 152/68   Pulse (!) 54   Temp 97.8 F (36.6 C)   Resp 14   Wt 220 lb (99.8 kg)   BMI 31.57 kg/m   Physical Exam  Constitutional: He is oriented to person, place, and time and well-developed, well-nourished, and in no distress.  HENT:  Head: Normocephalic and atraumatic.  Right Ear: External ear normal.  Left Ear: External ear normal.  Nose: Nose normal.  Eyes: Conjunctivae are normal. Pupils are equal, round, and reactive to light.  Neck: Normal range of motion. Neck supple. No thyromegaly present.  Cardiovascular: Normal rate, regular rhythm, normal heart sounds and intact distal pulses.   No murmur heard. Pulmonary/Chest: Effort normal and breath sounds normal. No respiratory distress. He has no wheezes.  Abdominal: Soft.  Neurological: He is alert and oriented to person, place, and time.  Skin: Skin is warm and dry.  Psychiatric: Mood, memory, affect and judgment normal.    Assessment and Plan :  1. Essential hypertension Elevated today still. Will increase Amlodipine to 10 mg. Re check on next visit.   2. Adult hypothyroidism 3Mild Obesity  HPI, Exam and A&P transcribed under direction and in the presence of [name], MD. I have done the exam and reviewed the chart and it is accurate to the best of my knowledge. Development worker, community has been used and  any errors in dictation or transcription are unintentional. [name] M.D. [facility]

## 2016-08-19 DIAGNOSIS — D2272 Melanocytic nevi of left lower limb, including hip: Secondary | ICD-10-CM | POA: Diagnosis not present

## 2016-08-19 DIAGNOSIS — X32XXXA Exposure to sunlight, initial encounter: Secondary | ICD-10-CM | POA: Diagnosis not present

## 2016-08-19 DIAGNOSIS — D225 Melanocytic nevi of trunk: Secondary | ICD-10-CM | POA: Diagnosis not present

## 2016-08-19 DIAGNOSIS — L570 Actinic keratosis: Secondary | ICD-10-CM | POA: Diagnosis not present

## 2016-08-19 DIAGNOSIS — Z85828 Personal history of other malignant neoplasm of skin: Secondary | ICD-10-CM | POA: Diagnosis not present

## 2016-08-19 DIAGNOSIS — D2261 Melanocytic nevi of right upper limb, including shoulder: Secondary | ICD-10-CM | POA: Diagnosis not present

## 2016-08-20 ENCOUNTER — Telehealth: Payer: Self-pay | Admitting: Family Medicine

## 2016-08-20 NOTE — Telephone Encounter (Signed)
Called Pt to schedule AWV with [facility] - knb °

## 2016-10-21 ENCOUNTER — Ambulatory Visit: Payer: PPO | Admitting: Family Medicine

## 2016-10-22 ENCOUNTER — Ambulatory Visit (INDEPENDENT_AMBULATORY_CARE_PROVIDER_SITE_OTHER): Payer: PPO | Admitting: Family Medicine

## 2016-10-22 ENCOUNTER — Ambulatory Visit (INDEPENDENT_AMBULATORY_CARE_PROVIDER_SITE_OTHER): Payer: PPO

## 2016-10-22 VITALS — BP 152/70 | HR 60 | Temp 97.8°F | Ht 70.0 in | Wt 220.8 lb

## 2016-10-22 VITALS — BP 150/72 | HR 60

## 2016-10-22 DIAGNOSIS — Z0000 Encounter for general adult medical examination without abnormal findings: Secondary | ICD-10-CM | POA: Diagnosis not present

## 2016-10-22 DIAGNOSIS — I10 Essential (primary) hypertension: Secondary | ICD-10-CM | POA: Diagnosis not present

## 2016-10-22 MED ORDER — AMLODIPINE BESYLATE 5 MG PO TABS: 5.0000 mg | ORAL_TABLET | Freq: Every day | ORAL | 3 refills | Status: DC

## 2016-10-22 NOTE — Progress Notes (Signed)
Subjective:   Larry Hernandez is a 71 y.o. male who presents for Medicare Annual/Subsequent preventive examination.  Review of Systems:  N/A  Cardiac Risk Factors include: advanced age (>58men, >54 women);hypertension;male gender;dyslipidemia;obesity (BMI >30kg/m2)     Objective:    Vitals: BP (!) 152/70 (BP Location: Left Arm)   Pulse 60   Temp 97.8 F (36.6 C) (Oral)   Ht 5\' 10"  (1.778 m)   Wt 220 lb 12.8 oz (100.2 kg)   BMI 31.68 kg/m   Body mass index is 31.68 kg/m.  Tobacco History  Smoking Status  . Never Smoker  Smokeless Tobacco  . Never Used     Counseling given: Not Answered   Past Medical History:  Diagnosis Date  . Cancer [facility]) 2012   skin caner face and ear  . Hernia, inguinal   . Hypertension   . Hypothyroid    Past Surgical History:  Procedure Laterality Date  . COLONOSCOPY  2012  . HERNIA REPAIR Left 06-13-14   inguinal   Family History  Problem Relation Age of Onset  . Kidney disease Father     died @ 50  . Cancer Father     kidney, was removed   . Heart disease Brother     died @ age 70   History  Sexual Activity  . Sexual activity: Not on file    Outpatient Encounter Prescriptions as of 10/22/2016  Medication Sig  . aspirin 325 MG tablet Take 325 mg by mouth 2 (two) times a week.   . levothyroxine (SYNTHROID, LEVOTHROID) 75 MCG tablet Take 1 tablet (75 mcg total) by mouth daily.  [name] losartan (COZAAR) 50 MG tablet Take 50 mg by mouth daily.  . simvastatin (ZOCOR) 10 MG tablet Take 1 tablet (10 mg total) by mouth at bedtime.  . tadalafil (CIALIS) 20 MG tablet Take 20 mg by mouth daily as needed for erectile dysfunction.  [name] amLODipine (NORVASC) 10 MG tablet Take 1 tablet (10 mg total) by mouth daily. (Patient not taking: Reported on 10/22/2016)   No facility-administered encounter medications on file as of 10/22/2016.     Activities of Daily Living In your present state of health, do you have any difficulty performing the  following activities: 10/22/2016  Hearing? Y  Vision? N  Difficulty concentrating or making decisions? N  Walking or climbing stairs? N  Dressing or bathing? N  Doing errands, shopping? N  Preparing Food and eating ? N  Using the Toilet? N  In the past six months, have you accidently leaked urine? N  Do you have problems with loss of bowel control? N  Managing your Medications? N  Managing your Finances? N  Housekeeping or managing your Housekeeping? N  Some recent data might be hidden    Patient Care Team: Jerrol Banana., MD as PCP - General (Family Medicine) Seeplaputhur Robinette Haines, MD (General Surgery) Thelma Comp, OD as Consulting Physician (Optometry) Kennieth Francois, MD as Consulting Physician (Dermatology)   Assessment:     Exercise Activities and Dietary recommendations Current Exercise Habits: The patient does not participate in regular exercise at present (gets exercise at job), Exercise limited by: None identified  Goals    . Exercise          Starting next week (10/27/16), I will start exercising at the gym 3 days a week for 40 minutes.       Fall Risk Fall Risk  10/22/2016 06/26/2015  Falls in the past  year? No No   Depression Screen PHQ 2/9 Scores 10/22/2016 06/26/2015  PHQ - 2 Score 0 0    Cognitive Function     6CIT Screen 10/22/2016  What Year? 0 points  What month? 0 points  What time? 0 points  Count back from 20 0 points  Months in reverse 0 points  Repeat phrase 0 points  Total Score 0    Immunization History  Administered Date(s) Administered  . Influenza, High Dose Seasonal PF 06/09/2016  . Influenza-Unspecified 05/10/2015  . Pneumococcal Conjugate-13 12/20/2014  . Pneumococcal Polysaccharide-23 01/06/2012  . Tdap 04/19/2009  . Zoster 01/06/2012   Screening Tests Health Maintenance  Topic Date Due  . TETANUS/TDAP  04/20/2019  . COLONOSCOPY  07/26/2019  . INFLUENZA VACCINE  Completed  . ZOSTAVAX  Completed  . Hepatitis  C Screening  Completed  . PNA vac Low Risk Adult  Completed      Plan:  I have personally reviewed and addressed the Medicare Annual Wellness questionnaire and have noted the following in the patient's chart:  A. Medical and social history B. Use of alcohol, tobacco or illicit drugs  C. Current medications and supplements D. Functional ability and status E.  Nutritional status F.  Physical activity G. Advance directives H. List of other physicians I.  Hospitalizations, surgeries, and ER visits in previous 12 months J.  [place] such as hearing and vision if needed, cognitive and depression L. Referrals and appointments - none  In addition, I have reviewed and discussed with patient certain preventive protocols, quality metrics, and best practice recommendations. A written personalized care plan for preventive services as well as general preventive health recommendations were provided to patient.  See attached scanned questionnaire for additional information.   Signed,  Fabio Neighbors, LPN Nurse Health Advisor   MD Recommendations: None. I have reviewed the health advisors note, was  available for consultation and I agree with documentation and plan. [name] MD [facility]

## 2016-10-22 NOTE — Progress Notes (Signed)
Patient: Larry Hernandez Male    DOB: 29-Jul-1946   71 y.o.   MRN: [id number] Visit Date: 10/22/2016  Today's Provider: Wilhemena Durie, MD   Chief Complaint  Patient presents with  . Hypertension   Subjective:    HPI      Hypertension, follow-up:  BP Readings from Last 3 Encounters:  10/22/16 (!) 150/72  10/22/16 (!) 152/70  08/14/16 (!) 152/68    He was last seen for hypertension 2 months ago.  BP at that visit was 152/68. Management since that visit includes increasing amlodipine to 10 mg. He reports poor compliance with treatment. He is having side effects. Swelling. Pt D/C amlodipine at the beginning of January and continued Losartan. He is exercising. Pt stays active at work. He is adherent to low salt diet.   Outside blood pressures are 110's/70's. Pt went to [facility] on 09/30/2016 for a routine employer comprehensive screening. BP at that time was 116/62. Pt was NOT taking the amlodipine at this time. He is experiencing none.  Patient denies chest pain, chest pressure/discomfort, claudication, dyspnea, exertional chest pressure/discomfort, fatigue, irregular heart beat, lower extremity edema, near-syncope, orthopnea, palpitations and syncope.   Cardiovascular risk factors include advanced age (older than 80 for men, 38 for women), dyslipidemia, family history of premature cardiovascular disease, hypertension and male gender.    Weight trend: stable Wt Readings from Last 3 Encounters:  10/22/16 220 lb 12.8 oz (100.2 kg)  08/14/16 220 lb (99.8 kg)  07/14/16 221 lb (100.2 kg)    Current diet: in general, a "healthy" diet    ------------------------------------------------------------------------   No Known Allergies   Current Outpatient Prescriptions:  .  aspirin 325 MG tablet, Take 325 mg by mouth 2 (two) times a week. , Disp: , Rfl:  .  levothyroxine (SYNTHROID, LEVOTHROID) 75 MCG tablet, Take 1 tablet (75 mcg total) by mouth daily.,  Disp: 90 tablet, Rfl: 3 .  losartan (COZAAR) 50 MG tablet, Take 50 mg by mouth daily., Disp: , Rfl:  .  simvastatin (ZOCOR) 10 MG tablet, Take 1 tablet (10 mg total) by mouth at bedtime., Disp: 90 tablet, Rfl: 3 .  tadalafil (CIALIS) 20 MG tablet, Take 20 mg by mouth daily as needed for erectile dysfunction., Disp: , Rfl:  .  amLODipine (NORVASC) 10 MG tablet, Take 1 tablet (10 mg total) by mouth daily. (Patient not taking: Reported on 10/22/2016), Disp: 90 tablet, Rfl: 3  Review of Systems  Constitutional: Negative for activity change, appetite change, chills, diaphoresis, fatigue, fever and unexpected weight change.  Respiratory: Negative for shortness of breath.   Cardiovascular: Negative for chest pain, palpitations and leg swelling.    Social History  Substance Use Topics  . Smoking status: Never Smoker  . Smokeless tobacco: Never Used  . Alcohol use No   Objective:   BP (!) 150/72 (BP Location: Right Arm, Patient Position: Sitting, Cuff Size: Large)   Pulse 60   Physical Exam  Constitutional: He appears well-developed and well-nourished.  HENT:  Head: Normocephalic.  Neck: Normal range of motion.  Cardiovascular: Normal rate, regular rhythm and normal heart sounds.   Pulmonary/Chest: Effort normal and breath sounds normal. No respiratory distress.  Musculoskeletal: He exhibits no edema.  Psychiatric: He has a normal mood and affect. His behavior is normal.        Assessment & Plan:     1. Essential hypertension Not to goal. Did not tolerate higher dose of Amlodipine.  Caused swelling. Will try 5 mg amlodipine. Call for problems. FU 3 months. - amLODipine (NORVASC) 5 MG tablet; Take 1 tablet (5 mg total) by mouth daily.  Dispense: 90 tablet; Refill: 3  2.Hypothyroid    Patient seen and examined by [name], MD, and note scribed by Renaldo Fiddler, CMA. I have done the exam and reviewed the above chart and it is accurate to the best of my knowledge. Risk manager has been used in this note in any air is in the dictation or transcription are unintentional.  Wilhemena Durie, MD  [place]

## 2016-10-22 NOTE — Patient Instructions (Signed)

## 2016-11-06 DIAGNOSIS — H353131 Nonexudative age-related macular degeneration, bilateral, early dry stage: Secondary | ICD-10-CM | POA: Diagnosis not present

## 2016-11-06 DIAGNOSIS — H2513 Age-related nuclear cataract, bilateral: Secondary | ICD-10-CM | POA: Diagnosis not present

## 2016-11-06 DIAGNOSIS — H25031 Anterior subcapsular polar age-related cataract, right eye: Secondary | ICD-10-CM | POA: Diagnosis not present

## 2016-11-06 DIAGNOSIS — H40023 Open angle with borderline findings, high risk, bilateral: Secondary | ICD-10-CM | POA: Diagnosis not present

## 2017-01-14 ENCOUNTER — Ambulatory Visit (INDEPENDENT_AMBULATORY_CARE_PROVIDER_SITE_OTHER): Payer: PPO | Admitting: Family Medicine

## 2017-01-14 ENCOUNTER — Encounter: Payer: Self-pay | Admitting: Family Medicine

## 2017-01-14 VITALS — BP 124/78 | HR 50 | Temp 98.1°F | Resp 16 | Wt 221.0 lb

## 2017-01-14 DIAGNOSIS — I10 Essential (primary) hypertension: Secondary | ICD-10-CM | POA: Diagnosis not present

## 2017-01-14 DIAGNOSIS — E039 Hypothyroidism, unspecified: Secondary | ICD-10-CM | POA: Diagnosis not present

## 2017-01-14 DIAGNOSIS — R42 Dizziness and giddiness: Secondary | ICD-10-CM

## 2017-01-14 NOTE — Progress Notes (Signed)
Patient: Larry Hernandez Male    DOB: 1946/06/16   71 y.o.   MRN: [id number] Visit Date: 01/14/2017  Today's Provider: Wilhemena Durie, MD   Chief Complaint  Patient presents with  . Hypertension   Subjective:    HPI  Hypertension, follow-up:  BP Readings from Last 3 Encounters:  01/14/17 124/78  10/22/16 (!) 150/72  10/22/16 (!) 152/70    He was last seen for hypertension 3 months ago.  BP at that visit was 150/72. Management since that visit includes decreasing amlodipine to 5mg  daily. He reports good compliance with treatment. He is not having side effects. However, patient reports that he did have an "episode" about 2 weeks ago. He reports that he became dizzy all of a sudden at work to the point where he could not walk. He reports that the episode lasted for about 23mins and then it went away. Patient reports that he has not had any other episodes since then.   He is not exercising, but he is active. He is adherent to low salt diet.   Outside blood pressures are checked once a week averaging in the 120s/70s. He is experiencing none.  Patient denies exertional chest pressure/discomfort and palpitations.   Cardiovascular risk factors include dyslipidemia.     Weight trend: stable Wt Readings from Last 3 Encounters:  01/14/17 221 lb (100.2 kg)  10/22/16 220 lb 12.8 oz (100.2 kg)  08/14/16 220 lb (99.8 kg)    Current diet: well balanced       No Known Allergies   Current Outpatient Prescriptions:  .  amLODipine (NORVASC) 5 MG tablet, Take 1 tablet (5 mg total) by mouth daily., Disp: 90 tablet, Rfl: 3 .  aspirin 325 MG tablet, Take 325 mg by mouth 2 (two) times a week. , Disp: , Rfl:  .  levothyroxine (SYNTHROID, LEVOTHROID) 75 MCG tablet, Take 1 tablet (75 mcg total) by mouth daily., Disp: 90 tablet, Rfl: 3 .  losartan (COZAAR) 50 MG tablet, Take 50 mg by mouth daily., Disp: , Rfl:  .  simvastatin (ZOCOR) 10 MG tablet, Take 1 tablet (10 mg total) by  mouth at bedtime., Disp: 90 tablet, Rfl: 3 .  tadalafil (CIALIS) 20 MG tablet, Take 20 mg by mouth daily as needed for erectile dysfunction., Disp: , Rfl:   Review of Systems  Constitutional: Negative.   Eyes: Negative.   Respiratory: Negative.   Cardiovascular: Negative.   Musculoskeletal: Negative.   Allergic/Immunologic: Negative.   Neurological: Negative.   Hematological: Negative.   Psychiatric/Behavioral: Negative.     Social History  Substance Use Topics  . Smoking status: Never Smoker  . Smokeless tobacco: Never Used  . Alcohol use No   Objective:   BP 124/78 (BP Location: Right Arm, Patient Position: Sitting, Cuff Size: Normal)   Pulse (!) 50   Temp 98.1 F (36.7 C)   Resp 16   Wt 221 lb (100.2 kg)   SpO2 98%   BMI 31.71 kg/m  Vitals:   01/14/17 1423  BP: 124/78  Pulse: (!) 50  Resp: 16  Temp: 98.1 F (36.7 C)  SpO2: 98%  Weight: 221 lb (100.2 kg)     Physical Exam  Constitutional: He is oriented to person, place, and time. He appears well-developed and well-nourished.  HENT:  Head: Normocephalic and atraumatic.  Right Ear: External ear normal.  Left Ear: External ear normal.  Eyes: Conjunctivae and EOM are normal. Pupils are equal, round,  and reactive to light.  Neck: Normal range of motion. Neck supple.  Cardiovascular: Normal rate, regular rhythm and normal heart sounds.   Pulmonary/Chest: Effort normal and breath sounds normal.  Neurological: He is alert and oriented to person, place, and time.  Skin: Skin is warm and dry.  Psychiatric: He has a normal mood and affect. His behavior is normal. Judgment and thought content normal.        Assessment & Plan:    1. Essential hypertension Stable. Continue current medications.  2. Dizziness Will monitor for now. Will consider carotid doppler if symptoms worsen.   3. Adult hypothyroidism F/U pending lab results.  - TSH        I have done the exam and reviewed the above chart and it is  accurate to the best of my knowledge. Development worker, community has been used in this note in any air is in the dictation or transcription are unintentional.  Wilhemena Durie, MD  [place]

## 2017-01-15 LAB — TSH: TSH: 3.34 u[IU]/mL (ref 0.450–4.500)

## 2017-01-28 DIAGNOSIS — H40023 Open angle with borderline findings, high risk, bilateral: Secondary | ICD-10-CM | POA: Diagnosis not present

## 2017-05-20 ENCOUNTER — Other Ambulatory Visit: Payer: Self-pay | Admitting: Family Medicine

## 2017-05-20 DIAGNOSIS — E7800 Pure hypercholesterolemia, unspecified: Secondary | ICD-10-CM

## 2017-05-20 DIAGNOSIS — E039 Hypothyroidism, unspecified: Secondary | ICD-10-CM

## 2017-07-09 LAB — LIPID PANEL
Cholesterol: 153 (ref 0–200)
HDL: 32 — AB (ref 35–70)
LDl/HDL Ratio: 4.7

## 2017-07-20 ENCOUNTER — Encounter: Payer: Self-pay | Admitting: Emergency Medicine

## 2017-07-20 ENCOUNTER — Ambulatory Visit: Payer: PPO | Admitting: Family Medicine

## 2017-07-20 ENCOUNTER — Encounter: Payer: Self-pay | Admitting: Family Medicine

## 2017-07-20 VITALS — BP 136/62 | HR 54 | Temp 98.1°F | Resp 14 | Wt 216.0 lb

## 2017-07-20 DIAGNOSIS — E7800 Pure hypercholesterolemia, unspecified: Secondary | ICD-10-CM | POA: Diagnosis not present

## 2017-07-20 DIAGNOSIS — I10 Essential (primary) hypertension: Secondary | ICD-10-CM | POA: Diagnosis not present

## 2017-07-20 DIAGNOSIS — E039 Hypothyroidism, unspecified: Secondary | ICD-10-CM | POA: Diagnosis not present

## 2017-07-20 LAB — CBC WITH DIFFERENTIAL/PLATELET
BASOS ABS: 32 {cells}/uL (ref 0–200)
BASOS PCT: 0.7 %
EOS ABS: 306 {cells}/uL (ref 15–500)
Eosinophils Relative: 6.8 %
HCT: 40 % (ref 38.5–50.0)
Hemoglobin: 13.4 g/dL (ref 13.2–17.1)
Lymphs Abs: 1152 cells/uL (ref 850–3900)
MCH: 30.8 pg (ref 27.0–33.0)
MCHC: 33.5 g/dL (ref 32.0–36.0)
MCV: 92 fL (ref 80.0–100.0)
MONOS PCT: 10.4 %
MPV: 10.5 fL (ref 7.5–12.5)
NEUTROS PCT: 56.5 %
Neutro Abs: 2543 cells/uL (ref 1500–7800)
PLATELETS: 167 10*3/uL (ref 140–400)
RBC: 4.35 10*6/uL (ref 4.20–5.80)
RDW: 12.5 % (ref 11.0–15.0)
TOTAL LYMPHOCYTE: 25.6 %
WBC: 4.5 10*3/uL (ref 3.8–10.8)
WBCMIX: 468 {cells}/uL (ref 200–950)

## 2017-07-20 LAB — COMPLETE METABOLIC PANEL WITH GFR
AG RATIO: 1.5 (calc) (ref 1.0–2.5)
ALBUMIN MSPROF: 3.8 g/dL (ref 3.6–5.1)
ALT: 10 U/L (ref 9–46)
AST: 13 U/L (ref 10–35)
Alkaline phosphatase (APISO): 60 U/L (ref 40–115)
BILIRUBIN TOTAL: 0.7 mg/dL (ref 0.2–1.2)
BUN / CREAT RATIO: 13 (calc) (ref 6–22)
BUN: 20 mg/dL (ref 7–25)
CALCIUM: 8.8 mg/dL (ref 8.6–10.3)
CHLORIDE: 109 mmol/L (ref 98–110)
CO2: 29 mmol/L (ref 20–32)
Creat: 1.6 mg/dL — ABNORMAL HIGH (ref 0.70–1.18)
GFR, EST NON AFRICAN AMERICAN: 43 mL/min/{1.73_m2} — AB (ref >=60)
GFR, Est African American: 50 mL/min/{1.73_m2} — ABNORMAL LOW (ref >=60)
Globulin: 2.6 g/dL (calc) (ref 1.9–3.7)
Glucose, Bld: 92 mg/dL (ref 65–99)
POTASSIUM: 5 mmol/L (ref 3.5–5.3)
Sodium: 141 mmol/L (ref 135–146)
TOTAL PROTEIN: 6.4 g/dL (ref 6.1–8.1)

## 2017-07-20 LAB — TSH: TSH: 3.31 m[IU]/L (ref 0.40–4.50)

## 2017-07-20 NOTE — Progress Notes (Signed)
Patient: Larry Hernandez Male    DOB: [birth year]   71 y.o.   MRN: [id number] Visit Date: 07/20/2017  Today's Provider: Wilhemena Durie, MD   Chief Complaint  Patient presents with  . Hypertension   Subjective:    HPI  Hypertension, follow-up:  BP Readings from Last 3 Encounters:  07/20/17 136/62  01/14/17 124/78  10/22/16 (!) 150/72    He was last seen for hypertension 6 months ago.  BP at that visit was 124/78. Management since that visit includes none. He reports good compliance with treatment. He is not having side effects.  He is exercising. He is adherent to low salt diet.   Outside blood pressures are running 130/70's.  Patient denies chest pain, chest pressure/discomfort, claudication, dyspnea, exertional chest pressure/discomfort, fatigue, irregular heart beat, lower extremity edema, near-syncope, orthopnea, palpitations, paroxysmal nocturnal dyspnea, syncope and tachypnea.   Cardiovascular risk factors include advanced age (older than 75 for men, 42 for women), dyslipidemia, hypertension and male gender.   Wt Readings from Last 3 Encounters:  07/20/17 216 lb (98 kg)  01/14/17 221 lb (100.2 kg)  10/22/16 220 lb 12.8 oz (100.2 kg)   ------------------------------------------------------------------------       No Known Allergies   Current Outpatient Medications:  .  amLODipine (NORVASC) 5 MG tablet, Take 1 tablet (5 mg total) by mouth daily., Disp: 90 tablet, Rfl: 3 .  aspirin 325 MG tablet, Take 325 mg by mouth 2 (two) times a week. , Disp: , Rfl:  .  levothyroxine (SYNTHROID, LEVOTHROID) 75 MCG tablet, TAKE 1 TABLET BY MOUTH DAILY, Disp: 90 tablet, Rfl: 3 .  losartan (COZAAR) 50 MG tablet, Take 50 mg by mouth daily., Disp: , Rfl:  .  simvastatin (ZOCOR) 10 MG tablet, TAKE 1 TABLET BY MOUTH AT BEDTIME( NEEDS APPOINTMENT AND LABS), Disp: 90 tablet, Rfl: 3 .  tadalafil (CIALIS) 20 MG tablet, Take 20 mg by mouth daily as needed for erectile  dysfunction., Disp: , Rfl:   Review of Systems  Constitutional: Negative.   HENT: Negative.   Eyes: Negative.   Respiratory: Negative.   Cardiovascular: Negative.   Gastrointestinal: Negative.   Endocrine: Negative.   Genitourinary: Negative.   Musculoskeletal: Negative.   Skin: Negative.   Allergic/Immunologic: Negative.   Neurological: Negative.   Hematological: Negative.   Psychiatric/Behavioral: Negative.     Social History   Tobacco Use  . Smoking status: Never Smoker  . Smokeless tobacco: Never Used  Substance Use Topics  . Alcohol use: No    Alcohol/week: 0.0 oz   Objective:   BP 136/62 (BP Location: Left Arm, Patient Position: Sitting, Cuff Size: Normal)   Pulse (!) 54   Temp 98.1 F (36.7 C) (Oral)   Resp 14   Wt 216 lb (98 kg)   BMI 30.99 kg/m  Vitals:   07/20/17 0951  BP: 136/62  Pulse: (!) 54  Resp: 14  Temp: 98.1 F (36.7 C)  TempSrc: Oral  Weight: 216 lb (98 kg)     Physical Exam  Constitutional: He is oriented to person, place, and time. He appears well-developed and well-nourished.  Eyes: Conjunctivae and EOM are normal. Pupils are equal, round, and reactive to light.  Neck: Normal range of motion. Neck supple.  Pulmonary/Chest: Effort normal and breath sounds normal.  Abdominal: Soft. Bowel sounds are normal.  Musculoskeletal: Normal range of motion.  Neurological: He is alert and oriented to person, place, and time. He has normal reflexes.  Skin: Skin is warm and dry.  Psychiatric: He has a normal mood and affect. His behavior is normal. Judgment and thought content normal.        Assessment & Plan:     1. Essential hypertension Stable follow up in 6 months - CBC with Differential/Platelet - TSH  2. Adult hypothyroidism   3. Hypercholesterolemia  - Comprehensive metabolic panel      HPI, Exam, and A&P Transcribed under the direction and in the presence of Richard L. Cranford Mon, MD  Electronically Signed: Katina Dung, CMA  I have done the exam and reviewed the above chart and it is accurate to the best of my knowledge. Development worker, community has been used in this note in any air is in the dictation or transcription are unintentional.  Wilhemena Durie, MD  [place]

## 2017-08-07 ENCOUNTER — Other Ambulatory Visit: Payer: Self-pay | Admitting: Family Medicine

## 2017-08-07 DIAGNOSIS — I10 Essential (primary) hypertension: Secondary | ICD-10-CM

## 2017-08-18 DIAGNOSIS — X32XXXA Exposure to sunlight, initial encounter: Secondary | ICD-10-CM | POA: Diagnosis not present

## 2017-08-18 DIAGNOSIS — D2261 Melanocytic nevi of right upper limb, including shoulder: Secondary | ICD-10-CM | POA: Diagnosis not present

## 2017-08-18 DIAGNOSIS — D485 Neoplasm of uncertain behavior of skin: Secondary | ICD-10-CM | POA: Diagnosis not present

## 2017-08-18 DIAGNOSIS — D225 Melanocytic nevi of trunk: Secondary | ICD-10-CM | POA: Diagnosis not present

## 2017-08-18 DIAGNOSIS — D2272 Melanocytic nevi of left lower limb, including hip: Secondary | ICD-10-CM | POA: Diagnosis not present

## 2017-08-18 DIAGNOSIS — Z85828 Personal history of other malignant neoplasm of skin: Secondary | ICD-10-CM | POA: Diagnosis not present

## 2017-08-18 DIAGNOSIS — C44319 Basal cell carcinoma of skin of other parts of face: Secondary | ICD-10-CM | POA: Diagnosis not present

## 2017-08-18 DIAGNOSIS — L570 Actinic keratosis: Secondary | ICD-10-CM | POA: Diagnosis not present

## 2017-10-26 DIAGNOSIS — C441192 Basal cell carcinoma of skin of left lower eyelid, including canthus: Secondary | ICD-10-CM | POA: Diagnosis not present

## 2017-10-26 DIAGNOSIS — Z85828 Personal history of other malignant neoplasm of skin: Secondary | ICD-10-CM | POA: Diagnosis not present

## 2017-11-05 ENCOUNTER — Other Ambulatory Visit: Payer: Self-pay | Admitting: Family Medicine

## 2017-11-05 DIAGNOSIS — I10 Essential (primary) hypertension: Secondary | ICD-10-CM

## 2017-11-10 DIAGNOSIS — H40013 Open angle with borderline findings, low risk, bilateral: Secondary | ICD-10-CM | POA: Diagnosis not present

## 2017-11-10 DIAGNOSIS — H353131 Nonexudative age-related macular degeneration, bilateral, early dry stage: Secondary | ICD-10-CM | POA: Diagnosis not present

## 2017-11-10 DIAGNOSIS — H52223 Regular astigmatism, bilateral: Secondary | ICD-10-CM | POA: Diagnosis not present

## 2017-11-10 DIAGNOSIS — H524 Presbyopia: Secondary | ICD-10-CM | POA: Diagnosis not present

## 2017-11-10 DIAGNOSIS — H2513 Age-related nuclear cataract, bilateral: Secondary | ICD-10-CM | POA: Diagnosis not present

## 2018-01-18 ENCOUNTER — Ambulatory Visit: Payer: Self-pay | Admitting: Family Medicine

## 2018-02-05 ENCOUNTER — Other Ambulatory Visit: Payer: Self-pay | Admitting: Family Medicine

## 2018-02-05 DIAGNOSIS — I10 Essential (primary) hypertension: Secondary | ICD-10-CM

## 2018-02-10 ENCOUNTER — Ambulatory Visit (INDEPENDENT_AMBULATORY_CARE_PROVIDER_SITE_OTHER): Payer: PPO | Admitting: Family Medicine

## 2018-02-10 VITALS — BP 130/68 | HR 50 | Temp 98.0°F | Resp 16 | Wt 210.0 lb

## 2018-02-10 DIAGNOSIS — G4733 Obstructive sleep apnea (adult) (pediatric): Secondary | ICD-10-CM | POA: Diagnosis not present

## 2018-02-10 DIAGNOSIS — N529 Male erectile dysfunction, unspecified: Secondary | ICD-10-CM | POA: Diagnosis not present

## 2018-02-10 DIAGNOSIS — I10 Essential (primary) hypertension: Secondary | ICD-10-CM | POA: Diagnosis not present

## 2018-02-10 DIAGNOSIS — E7800 Pure hypercholesterolemia, unspecified: Secondary | ICD-10-CM

## 2018-02-10 MED ORDER — SILDENAFIL CITRATE 20 MG PO TABS: 20.0000 mg | ORAL_TABLET | ORAL | 11 refills | Status: DC

## 2018-02-10 NOTE — Progress Notes (Signed)
ONTARIO PETTENGILL  MRN: [id number] DOB: 1946-04-23  Subjective:  HPI   The patient is a 72 year old male who presents for follow up of chronic health. He was last seen on 07/20/17.  At that time he did have all routine labs.    Hypertension-His readings have been stable for the last year.  He checks his blood pressure outside of the office and states he has been getting good readings.    BP Readings from Last 3 Encounters:  02/10/18 130/68  07/20/17 136/62  01/14/17 124/78    Patient Active Problem List   Diagnosis Date Noted  . Actinic keratosis 06/26/2015  . ED (erectile dysfunction) of organic origin 06/26/2015  . Hypercholesterolemia 06/26/2015  . Adiposity 11/17/2009  . Apnea, sleep 11/17/2009  . Urinary system disease 10/23/2009  . Adult hypothyroidism 10/23/2009  . Hypertension 05/22/2009    Past Medical History:  Diagnosis Date  . Cancer [facility]) 2012   skin caner face and ear  . Hernia, inguinal   . Hypertension   . Hypothyroid     Social History   Socioeconomic History  . Marital status: Married    Spouse name: Not on file  . Number of children: Not on file  . Years of education: Not on file  . Highest education level: Not on file  Occupational History  . Not on file  Social Needs  . Financial resource strain: Not on file  . Food insecurity:    Worry: Not on file    Inability: Not on file  . Transportation needs:    Medical: Not on file    Non-medical: Not on file  Tobacco Use  . Smoking status: Never Smoker  . Smokeless tobacco: Never Used  Substance and Sexual Activity  . Alcohol use: No    Alcohol/week: 0.0 oz  . Drug use: No  . Sexual activity: Not on file  Lifestyle  . Physical activity:    Days per week: Not on file    Minutes per session: Not on file  . Stress: Not on file  Relationships  . Social connections:    Talks on phone: Not on file    Gets together: Not on file    Attends religious service: Not on file    Active member  of club or organization: Not on file    Attends meetings of clubs or organizations: Not on file    Relationship status: Not on file  . Intimate partner violence:    Fear of current or ex partner: Not on file    Emotionally abused: Not on file    Physically abused: Not on file    Forced sexual activity: Not on file  Other Topics Concern  . Not on file  Social History Narrative  . Not on file    Outpatient Encounter Medications as of 02/10/2018  Medication Sig  . amLODipine (NORVASC) 5 MG tablet TAKE 1 TABLET(5 MG) BY MOUTH DAILY  . aspirin 325 MG tablet Take 325 mg by mouth 2 (two) times a week.   . levothyroxine (SYNTHROID, LEVOTHROID) 75 MCG tablet TAKE 1 TABLET BY MOUTH DAILY  . losartan (COZAAR) 50 MG tablet TAKE 1 TABLET(50 MG) BY MOUTH DAILY  . simvastatin (ZOCOR) 10 MG tablet TAKE 1 TABLET BY MOUTH AT BEDTIME( NEEDS APPOINTMENT AND LABS)  . tadalafil (CIALIS) 20 MG tablet Take 20 mg by mouth daily as needed for erectile dysfunction.  . [DISCONTINUED] losartan (COZAAR) 50 MG tablet Take 50 mg  by mouth daily.   No facility-administered encounter medications on file as of 02/10/2018.     No Known Allergies  Review of Systems  Constitutional: Negative for fever and malaise/fatigue.  HENT: Negative.   Eyes: Negative.   Respiratory: Positive for cough (possibly allergy related). Negative for shortness of breath and wheezing.   Cardiovascular: Negative for chest pain, palpitations, orthopnea, claudication and leg swelling.  Gastrointestinal: Negative.   Skin: Negative.   Endo/Heme/Allergies: Negative.   Psychiatric/Behavioral: Negative.     Objective:  BP 130/68 (BP Location: Right Arm, Patient Position: Sitting, Cuff Size: Normal)   Pulse (!) 50   Temp 98 F (36.7 C) (Oral)   Resp 16   Wt 210 lb (95.3 kg)   SpO2 98%   BMI 30.13 kg/m   Physical Exam  Constitutional: He is oriented to person, place, and time and well-developed, well-nourished, and in no distress.  HENT:   Head: Normocephalic and atraumatic.  Right Ear: External ear normal.  Left Ear: External ear normal.  Nose: Nose normal.  Eyes: Conjunctivae are normal. No scleral icterus.  Neck: No thyromegaly present.  Cardiovascular: Normal rate, regular rhythm, normal heart sounds and intact distal pulses.  Pulmonary/Chest: Effort normal and breath sounds normal.  Abdominal: Soft.  Lymphadenopathy:    He has no cervical adenopathy.  Neurological: He is alert and oriented to person, place, and time. Gait normal. GCS score is 15.  Skin: Skin is warm and dry.  Psychiatric: Mood, memory, affect and judgment normal.    Assessment and Plan :  AAA F/u with Dr Bary Castilla HTN HLD Hypothyroid Stop ASA. ED Rx Sildenafil prn.  I have done the exam and reviewed the chart and it is accurate to the best of my knowledge. Development worker, community has been used and  any errors in dictation or transcription are unintentional. [name] M.D. [facility]

## 2018-02-24 DIAGNOSIS — X32XXXA Exposure to sunlight, initial encounter: Secondary | ICD-10-CM | POA: Diagnosis not present

## 2018-02-24 DIAGNOSIS — Z85828 Personal history of other malignant neoplasm of skin: Secondary | ICD-10-CM | POA: Diagnosis not present

## 2018-02-24 DIAGNOSIS — D2272 Melanocytic nevi of left lower limb, including hip: Secondary | ICD-10-CM | POA: Diagnosis not present

## 2018-02-24 DIAGNOSIS — D225 Melanocytic nevi of trunk: Secondary | ICD-10-CM | POA: Diagnosis not present

## 2018-02-24 DIAGNOSIS — D2262 Melanocytic nevi of left upper limb, including shoulder: Secondary | ICD-10-CM | POA: Diagnosis not present

## 2018-02-24 DIAGNOSIS — L570 Actinic keratosis: Secondary | ICD-10-CM | POA: Diagnosis not present

## 2018-02-24 DIAGNOSIS — D2271 Melanocytic nevi of right lower limb, including hip: Secondary | ICD-10-CM | POA: Diagnosis not present

## 2018-02-24 DIAGNOSIS — D2261 Melanocytic nevi of right upper limb, including shoulder: Secondary | ICD-10-CM | POA: Diagnosis not present

## 2018-02-24 DIAGNOSIS — Z08 Encounter for follow-up examination after completed treatment for malignant neoplasm: Secondary | ICD-10-CM | POA: Diagnosis not present

## 2018-05-05 ENCOUNTER — Other Ambulatory Visit: Payer: Self-pay | Admitting: Family Medicine

## 2018-05-05 DIAGNOSIS — E7800 Pure hypercholesterolemia, unspecified: Secondary | ICD-10-CM

## 2018-05-05 DIAGNOSIS — E039 Hypothyroidism, unspecified: Secondary | ICD-10-CM

## 2018-05-14 ENCOUNTER — Other Ambulatory Visit: Payer: Self-pay

## 2018-05-14 DIAGNOSIS — I714 Abdominal aortic aneurysm, without rupture, unspecified: Secondary | ICD-10-CM

## 2018-05-17 ENCOUNTER — Other Ambulatory Visit: Payer: Self-pay

## 2018-05-17 NOTE — Progress Notes (Signed)
Error

## 2018-05-19 ENCOUNTER — Ambulatory Visit (INDEPENDENT_AMBULATORY_CARE_PROVIDER_SITE_OTHER): Payer: PPO | Admitting: Family Medicine

## 2018-05-19 ENCOUNTER — Ambulatory Visit (INDEPENDENT_AMBULATORY_CARE_PROVIDER_SITE_OTHER): Payer: PPO

## 2018-05-19 VITALS — BP 130/60 | HR 62 | Temp 97.8°F | Resp 16 | Ht 70.0 in | Wt 210.0 lb

## 2018-05-19 VITALS — BP 130/60 | HR 62 | Temp 97.8°F | Ht 70.0 in | Wt 210.2 lb

## 2018-05-19 DIAGNOSIS — Z8051 Family history of malignant neoplasm of kidney: Secondary | ICD-10-CM

## 2018-05-19 DIAGNOSIS — E039 Hypothyroidism, unspecified: Secondary | ICD-10-CM | POA: Diagnosis not present

## 2018-05-19 DIAGNOSIS — E7800 Pure hypercholesterolemia, unspecified: Secondary | ICD-10-CM

## 2018-05-19 DIAGNOSIS — I714 Abdominal aortic aneurysm, without rupture, unspecified: Secondary | ICD-10-CM

## 2018-05-19 DIAGNOSIS — Z0000 Encounter for general adult medical examination without abnormal findings: Secondary | ICD-10-CM | POA: Diagnosis not present

## 2018-05-19 DIAGNOSIS — G4733 Obstructive sleep apnea (adult) (pediatric): Secondary | ICD-10-CM | POA: Diagnosis not present

## 2018-05-19 DIAGNOSIS — I10 Essential (primary) hypertension: Secondary | ICD-10-CM

## 2018-05-19 DIAGNOSIS — Z23 Encounter for immunization: Secondary | ICD-10-CM

## 2018-05-19 LAB — POCT URINALYSIS DIPSTICK
Bilirubin, UA: NEGATIVE
Glucose, UA: NEGATIVE
KETONES UA: NEGATIVE
LEUKOCYTES UA: NEGATIVE
NITRITE UA: NEGATIVE
PH UA: 5 (ref 5.0–8.0)
PROTEIN UA: NEGATIVE
RBC UA: NEGATIVE
SPEC GRAV UA: 1.015 (ref 1.010–1.025)
UROBILINOGEN UA: 0.2 U/dL

## 2018-05-19 NOTE — Patient Instructions (Addendum)
Larry Hernandez , Thank you for taking time to come for your Medicare Wellness Visit. I appreciate your ongoing commitment to your health goals. Please review the following plan we discussed and let me know if I can assist you in the future.   Screening recommendations/referrals: Colonoscopy: Up to date Recommended yearly ophthalmology/optometry visit for glaucoma screening and checkup Recommended yearly dental visit for hygiene and checkup  Vaccinations: Influenza vaccine: Up to date Pneumococcal vaccine: Up to date Tdap vaccine: Up to date Shingles vaccine: Pt declines today.     Advanced directives: Please bring a copy of your POA (Power of Attorney) and/or Living Will to your next appointment.   Conditions/risks identified: Recommend increasing water intake to 4-6 glasses a day.   Next appointment: 2:00 PM today with Dr Rosanna Randy.   Preventive Care 71 Years and Older, Male Preventive care refers to lifestyle choices and visits with your health care provider that can promote health and wellness. What does preventive care include?  A yearly physical exam. This is also called an annual well check.  Dental exams once or twice a year.  Routine eye exams. Ask your health care provider how often you should have your eyes checked.  Personal lifestyle choices, including:  Daily care of your teeth and gums.  Regular physical activity.  Eating a healthy diet.  Avoiding tobacco and drug use.  Limiting alcohol use.  Practicing safe sex.  Taking low doses of aspirin every day.  Taking vitamin and mineral supplements as recommended by your health care provider. What happens during an annual well check? The services and screenings done by your health care provider during your annual well check will depend on your age, overall health, lifestyle risk factors, and family history of disease. Counseling  Your health care provider may ask you questions about your:  Alcohol use.  Tobacco  use.  Drug use.  Emotional well-being.  Home and relationship well-being.  Sexual activity.  Eating habits.  History of falls.  Memory and ability to understand (cognition).  Work and work Statistician. Screening  You may have the following tests or measurements:  Height, weight, and BMI.  Blood pressure.  Lipid and cholesterol levels. These may be checked every 5 years, or more frequently if you are over 28 years old.  Skin check.  Lung cancer screening. You may have this screening every year starting at age 71 if you have a 30-pack-year history of smoking and currently smoke or have quit within the past 15 years.  Fecal occult blood test (FOBT) of the stool. You may have this test every year starting at age 71.  Flexible sigmoidoscopy or colonoscopy. You may have a sigmoidoscopy every 5 years or a colonoscopy every 10 years starting at age 71.  Prostate cancer screening. Recommendations will vary depending on your family history and other risks.  Hepatitis C blood test.  Hepatitis B blood test.  Sexually transmitted disease (STD) testing.  Diabetes screening. This is done by checking your blood sugar (glucose) after you have not eaten for a while (fasting). You may have this done every 1-3 years.  Abdominal aortic aneurysm (AAA) screening. You may need this if you are a current or former smoker.  Osteoporosis. You may be screened starting at age 71 if you are at high risk. Talk with your health care provider about your test results, treatment options, and if necessary, the need for more tests. Vaccines  Your health care provider may recommend certain vaccines, such as:  Influenza vaccine. This is recommended every year.  Tetanus, diphtheria, and acellular pertussis (Tdap, Td) vaccine. You may need a Td booster every 10 years.  Zoster vaccine. You may need this after age 71.  Pneumococcal 13-valent conjugate (PCV13) vaccine. One dose is recommended after age  71.  Pneumococcal polysaccharide (PPSV23) vaccine. One dose is recommended after age 71. Talk to your health care provider about which screenings and vaccines you need and how often you need them. This information is not intended to replace advice given to you by your health care provider. Make sure you discuss any questions you have with your health care provider. Document Released: 09/21/2015 Document Revised: 05/14/2016 Document Reviewed: 06/26/2015 Elsevier Interactive Patient Education  2017 [place] Prevention in the Home Falls can cause injuries. They can happen to people of all ages. There are many things you can do to make your home safe and to help prevent falls. What can I do on the outside of my home?  Regularly fix the edges of walkways and driveways and fix any cracks.  Remove anything that might make you trip as you walk through a door, such as a raised step or threshold.  Trim any bushes or trees on the path to your home.  Use bright outdoor lighting.  Clear any walking paths of anything that might make someone trip, such as rocks or tools.  Regularly check to see if handrails are loose or broken. Make sure that both sides of any steps have handrails.  Any raised decks and porches should have guardrails on the edges.  Have any leaves, snow, or ice cleared regularly.  Use sand or salt on walking paths during winter.  Clean up any spills in your garage right away. This includes oil or grease spills. What can I do in the bathroom?  Use night lights.  Install grab bars by the toilet and in the tub and shower. Do not use towel bars as grab bars.  Use non-skid mats or decals in the tub or shower.  If you need to sit down in the shower, use a plastic, non-slip stool.  Keep the floor dry. Clean up any water that spills on the floor as soon as it happens.  Remove soap buildup in the tub or shower regularly.  Attach bath mats securely with double-sided  non-slip rug tape.  Do not have throw rugs and other things on the floor that can make you trip. What can I do in the bedroom?  Use night lights.  Make sure that you have a light by your bed that is easy to reach.  Do not use any sheets or blankets that are too big for your bed. They should not hang down onto the floor.  Have a firm chair that has side arms. You can use this for support while you get dressed.  Do not have throw rugs and other things on the floor that can make you trip. What can I do in the kitchen?  Clean up any spills right away.  Avoid walking on wet floors.  Keep items that you use a lot in easy-to-reach places.  If you need to reach something above you, use a strong step stool that has a grab bar.  Keep electrical cords out of the way.  Do not use floor polish or wax that makes floors slippery. If you must use wax, use non-skid floor wax.  Do not have throw rugs and other things on the floor that  can make you trip. What can I do with my stairs?  Do not leave any items on the stairs.  Make sure that there are handrails on both sides of the stairs and use them. Fix handrails that are broken or loose. Make sure that handrails are as long as the stairways.  Check any carpeting to make sure that it is firmly attached to the stairs. Fix any carpet that is loose or worn.  Avoid having throw rugs at the top or bottom of the stairs. If you do have throw rugs, attach them to the floor with carpet tape.  Make sure that you have a light switch at the top of the stairs and the bottom of the stairs. If you do not have them, ask someone to add them for you. What else can I do to help prevent falls?  Wear shoes that:  Do not have high heels.  Have rubber bottoms.  Are comfortable and fit you well.  Are closed at the toe. Do not wear sandals.  If you use a stepladder:  Make sure that it is fully opened. Do not climb a closed stepladder.  Make sure that both  sides of the stepladder are locked into place.  Ask someone to hold it for you, if possible.  Clearly mark and make sure that you can see:  Any grab bars or handrails.  First and last steps.  Where the edge of each step is.  Use tools that help you move around (mobility aids) if they are needed. These include:  Canes.  Walkers.  Scooters.  Crutches.  Turn on the lights when you go into a dark area. Replace any light bulbs as soon as they burn out.  Set up your furniture so you have a clear path. Avoid moving your furniture around.  If any of your floors are uneven, fix them.  If there are any pets around you, be aware of where they are.  Review your medicines with your doctor. Some medicines can make you feel dizzy. This can increase your chance of falling. Ask your doctor what other things that you can do to help prevent falls. This information is not intended to replace advice given to you by your health care provider. Make sure you discuss any questions you have with your health care provider. Document Released: 06/21/2009 Document Revised: 01/31/2016 Document Reviewed: 09/29/2014 Elsevier Interactive Patient Education  2017 [employer].

## 2018-05-19 NOTE — Progress Notes (Signed)
Subjective:   Larry Hernandez is a 72 y.o. male who presents for Medicare Annual/Subsequent preventive examination.  Review of Systems:  N/A  Cardiac Risk Factors include: advanced age (>21men, >36 women);dyslipidemia;hypertension;obesity (BMI >30kg/m2)     Objective:    Vitals: BP 130/60 (BP Location: Right Arm)   Pulse 62   Temp 97.8 F (36.6 C) (Oral)   Ht 5\' 10"  (1.778 m)   Wt 210 lb 3.2 oz (95.3 kg)   BMI 30.16 kg/m   Body mass index is 30.16 kg/m.  Advanced Directives 05/19/2018 10/22/2016 06/09/2016  Does Patient Have a Medical Advance Directive? Yes No No  Type of Paramedic of [place];Living will - -  Copy of [place] in Chart? No - copy requested - -  Would patient like information on creating a medical advance directive? - Yes (MAU/Ambulatory/Procedural Areas - Information given) -    Tobacco Social History   Tobacco Use  Smoking Status Never Smoker  Smokeless Tobacco Never Used     Counseling given: Not Answered   Clinical Intake:  Pre-visit preparation completed: Yes  Pain : No/denies pain Pain Score: 0-No pain     Nutritional Status: BMI > 30  Obese Nutritional Risks: None Diabetes: No  How often do you need to have someone help you when you read instructions, pamphlets, or other written materials from your doctor or pharmacy?: 1 - Never  Interpreter Needed?: No  Information entered by :: [facility], LPN  Past Medical History:  Diagnosis Date  . Cancer [facility]) 2012   skin caner face and ear  . Hernia, inguinal   . Hyperlipidemia   . Hypertension   . Hypothyroid    Past Surgical History:  Procedure Laterality Date  . BASAL CELL CARCINOMA EXCISION    . COLONOSCOPY  2012  . HERNIA REPAIR Left 06-13-14   inguinal   Family History  Problem Relation Age of Onset  . Kidney disease Father        died @ 75  . Cancer Father        kidney, was removed   . Heart disease Brother        died @  age 78   Social History   Socioeconomic History  . Marital status: Married    Spouse name: Not on file  . Number of children: 1  . Years of education: Not on file  . Highest education level: Bachelor's degree (e.g., BA, AB, BS)  Occupational History  . 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 Financial resource strain: Not hard at all  . Food insecurity:    Worry: Never true    Inability: Never true  . Transportation needs:    Medical: No    Non-medical: No  Tobacco Use  . Smoking status: Never Smoker  . Smokeless tobacco: Never Used  Substance and Sexual Activity  . Alcohol use: No    Alcohol/week: 0.0 standard drinks  . Drug use: No  . Sexual activity: Not on file  Lifestyle  . Physical activity:    Days per week: Not on file    Minutes per session: Not on file  . Stress: Only a little  Relationships  . Social connections:    Talks on phone: Not on file    Gets together: Not on file    Attends religious service: Not on file    Active member of club or organization: Not  on file    Attends meetings of clubs or organizations: Not on file    Relationship status: Not on file  Other Topics Concern  . Not on file  Social History Narrative  . Not on file    Outpatient Encounter Medications as of 05/19/2018  Medication Sig  . amLODipine (NORVASC) 5 MG tablet TAKE 1 TABLET(5 MG) BY MOUTH DAILY  . levothyroxine (SYNTHROID, LEVOTHROID) 75 MCG tablet TAKE 1 TABLET BY MOUTH DAILY  . losartan (COZAAR) 50 MG tablet TAKE 1 TABLET(50 MG) BY MOUTH DAILY  . sildenafil (REVATIO) 20 MG tablet Take 1 tablet (20 mg total) by mouth See admin instructions. 1-5 daily as needed  . simvastatin (ZOCOR) 10 MG tablet TAKE 1 TABLET BY MOUTH AT BEDTIME( NEEDS APPOINTMENT AND LABS)   No facility-administered encounter medications on file as of 05/19/2018.     Activities of Daily Living In your present state of health, do you have any difficulty performing the  following activities: 05/19/2018 02/10/2018  Hearing? Y N  Comment Does not wear hearing aids.  -  Vision? N N  Difficulty concentrating or making decisions? N N  Walking or climbing stairs? N N  Dressing or bathing? N N  Doing errands, shopping? N N  Preparing Food and eating ? N -  Using the Toilet? N -  In the past six months, have you accidently leaked urine? N -  Do you have problems with loss of bowel control? N -  Managing your Medications? N -  Managing your Finances? N -  Housekeeping or managing your Housekeeping? N -  Some recent data might be hidden    Patient Care Team: Jerrol Banana., MD as PCP - General (Family Medicine) Thelma Comp, [place] as Consulting Physician (Optometry) Dasher, Rayvon Char, MD as Consulting Physician (Dermatology)   Assessment:   This is a routine wellness examination for Larry Hernandez.  Exercise Activities and Dietary recommendations Current Exercise Habits: The patient has a physically strenous job, but has no regular exercise apart from work., Exercise limited by: None identified  Goals    . DIET - INCREASE WATER INTAKE     Recommend increasing water intake to 4-6 glasses a day.     . Exercise     Starting next week (10/27/16), I will start exercising at the gym 3 days a week for 40 minutes.        Fall Risk Fall Risk  05/19/2018 02/10/2018 10/22/2016 06/26/2015  Falls in the past year? No No No No   Is the patient's home free of loose throw rugs in walkways, pet beds, electrical cords, etc?   yes      Grab bars in the bathroom? no      Handrails on the stairs?   yes      Adequate lighting?   yes  Timed Get Up and Go Performed: N/A  Depression Screen PHQ 2/9 Scores 05/19/2018 02/10/2018 10/22/2016 06/26/2015  PHQ - 2 Score 0 0 0 0  PHQ- 9 Score - 1 - -    Cognitive Function: Pt declined screening today.      6CIT Screen 10/22/2016  What Year? 0 points  What month? 0 points  What time? 0 points  Count back from 20 0 points    Months in reverse 0 points  Repeat phrase 0 points  Total Score 0    Immunization History  Administered Date(s) Administered  . Influenza Split 06/26/2009, 06/10/2011  . Influenza, High Dose Seasonal PF  06/09/2016  . Influenza,inj,Quad PF,6+ Mos 05/26/2013  . Influenza-Unspecified 05/10/2015  . Pneumococcal Conjugate-13 12/20/2014  . Pneumococcal Polysaccharide-23 01/06/2012  . Tdap 04/19/2009  . Zoster 01/06/2012    Qualifies for Shingles Vaccine? Due for Shingles vaccine. Declined my offer to administer today. Education has been provided regarding the importance of this vaccine. Pt has been advised to call her insurance company to determine her out of pocket expense. Advised she may also receive this vaccine at her local pharmacy or Health Dept. Verbalized acceptance and understanding.  Screening Tests Health Maintenance  Topic Date Due  . TETANUS/TDAP  04/20/2019  . COLONOSCOPY  07/26/2019  . INFLUENZA VACCINE  Completed  . Hepatitis C Screening  Completed  . PNA vac Low Risk Adult  Completed   Cancer Screenings: Lung: Low Dose CT Chest recommended if Age 31-80 years, 30 pack-year currently smoking OR have quit w/in 15years. Patient does not qualify. Colorectal: Up to date  Additional Screenings:  Hepatitis C Screening: Up to date      Plan:  I have personally reviewed and addressed the Medicare Annual Wellness questionnaire and have noted the following in the patient's chart:  A. Medical and social history B. Use of alcohol, tobacco or illicit drugs  C. Current medications and supplements D. Functional ability and status E.  Nutritional status F.  Physical activity G. Advance directives H. List of other physicians I.  Hospitalizations, surgeries, and ER visits in previous 12 months J.  [place] such as hearing and vision if needed, cognitive and depression L. Referrals and appointments - none  In addition, I have reviewed and discussed with patient  certain preventive protocols, quality metrics, and best practice recommendations. A written personalized care plan for preventive services as well as general preventive health recommendations were provided to patient.  See attached scanned questionnaire for additional information.   Signed,  Fabio Neighbors, LPN Nurse Health Advisor   Nurse Recommendations: None.

## 2018-05-19 NOTE — Progress Notes (Signed)
Patient: Larry Hernandez, Male    DOB: [birth year], 72 y.o.   MRN: [id number] Visit Date: 05/19/2018  Today's Provider: Wilhemena Durie, MD   Chief Complaint  Patient presents with  . Annual Exam   Subjective:  Larry Hernandez is a 72 y.o. male who presents today for health maintenance and complete physical. He feels well. He reports exercising none, but very active at work. He reports he is sleeping well. AR issues--allergic to plastics. Pt doing well with weight. He needs lab work for BP,thyroid,chronic proiblems. 07/25/2009 Colonoscopy, Dr Ginger Carne, repeat 10 years Immunization History  Administered Date(s) Administered  . Influenza Split 06/26/2009, 06/10/2011  . Influenza, High Dose Seasonal PF 06/09/2016, 05/19/2018  . Influenza,inj,Quad PF,6+ Mos 05/26/2013  . Influenza-Unspecified 05/10/2015  . Pneumococcal Conjugate-13 12/20/2014  . Pneumococcal Polysaccharide-23 01/06/2012  . Tdap 04/19/2009  . Zoster 01/06/2012     Review of Systems  Constitutional: Negative.   HENT: Negative.   Eyes: Negative.   Respiratory: Negative.   Cardiovascular: Negative.   Gastrointestinal: Negative.   Endocrine: Negative.   Genitourinary: Negative.   Musculoskeletal: Negative.   Skin: Negative.   Allergic/Immunologic: Negative.   Neurological: Negative.   Hematological: Negative.   Psychiatric/Behavioral: Negative.     Social History   Socioeconomic History  . Marital status: Married    Spouse name: Not on file  . Number of children: 1  . Years of education: Not on file  . Highest education level: Bachelor's degree (e.g., BA, AB, BS)  Occupational History  . 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 Financial resource strain: Not hard at all  . Food insecurity:    Worry: Never true    Inability: Never true  . Transportation needs:    Medical: No    Non-medical: No  Tobacco Use  . Smoking status: Never Smoker  . Smokeless tobacco: Never  Used  Substance and Sexual Activity  . Alcohol use: No    Alcohol/week: 0.0 standard drinks  . Drug use: No  . Sexual activity: Not on file  Lifestyle  . Physical activity:    Days per week: Not on file    Minutes per session: Not on file  . Stress: Only a little  Relationships  . Social connections:    Talks on phone: Not on file    Gets together: Not on file    Attends religious service: Not on file    Active member of club or organization: Not on file    Attends meetings of clubs or organizations: Not on file    Relationship status: Not on file  . Intimate partner violence:    Fear of current or ex partner: Not on file    Emotionally abused: Not on file    Physically abused: Not on file    Forced sexual activity: Not on file  Other Topics Concern  . Not on file  Social History Narrative  . Not on file    Patient Active Problem List   Diagnosis Date Noted  . Actinic keratosis 06/26/2015  . ED (erectile dysfunction) of organic origin 06/26/2015  . Hypercholesterolemia 06/26/2015  . Adiposity 11/17/2009  . Apnea, sleep 11/17/2009  . Urinary system disease 10/23/2009  . Adult hypothyroidism 10/23/2009  . Hypertension 05/22/2009    Past Surgical History:  Procedure Laterality Date  . BASAL CELL CARCINOMA EXCISION    . COLONOSCOPY  2012  . HERNIA REPAIR Left 06-13-14  inguinal    His family history includes Cancer in his father; Heart disease in his brother; Kidney disease in his father.     Outpatient Encounter Medications as of 05/19/2018  Medication Sig  . amLODipine (NORVASC) 5 MG tablet TAKE 1 TABLET(5 MG) BY MOUTH DAILY  . levothyroxine (SYNTHROID, LEVOTHROID) 75 MCG tablet TAKE 1 TABLET BY MOUTH DAILY  . losartan (COZAAR) 50 MG tablet TAKE 1 TABLET(50 MG) BY MOUTH DAILY  . sildenafil (REVATIO) 20 MG tablet Take 1 tablet (20 mg total) by mouth See admin instructions. 1-5 daily as needed  . simvastatin (ZOCOR) 10 MG tablet TAKE 1 TABLET BY MOUTH AT BEDTIME(  NEEDS APPOINTMENT AND LABS)   No facility-administered encounter medications on file as of 05/19/2018.     Patient Care Team: Jerrol Banana., MD as PCP - General (Family Medicine) Thelma Comp, [place] as Consulting Physician (Optometry) Dasher, Rayvon Char, MD as Consulting Physician (Dermatology)      Objective:   Vitals:  Vitals:   05/19/18 1408  BP: 130/60  Pulse: 62  Resp: 16  Temp: 97.8 F (36.6 C)  TempSrc: Oral  Weight: 210 lb (95.3 kg)  Height: 5\' 10"  (1.778 m)    Physical Exam  Constitutional: He is oriented to person, place, and time. He appears well-developed and well-nourished.  HENT:  Head: Normocephalic and atraumatic.  Right Ear: External ear normal.  Left Ear: External ear normal.  Nose: Nose normal.  Mouth/Throat: Oropharynx is clear and moist.  Eyes: Pupils are equal, round, and reactive to light. Conjunctivae and EOM are normal.  Neck: Normal range of motion. Neck supple.  Cardiovascular: Normal rate, regular rhythm, normal heart sounds and intact distal pulses.  Pulmonary/Chest: Effort normal and breath sounds normal.  Abdominal: Soft. Bowel sounds are normal.  Genitourinary: Rectum normal, prostate normal and penis normal.  Musculoskeletal: Normal range of motion.  Neurological: He is alert and oriented to person, place, and time.  Skin: Skin is warm and dry.  Psychiatric: He has a normal mood and affect. His behavior is normal. Judgment and thought content normal.     Depression Screen PHQ 2/9 Scores 05/19/2018 02/10/2018 10/22/2016 06/26/2015  PHQ - 2 Score 0 0 0 0  PHQ- 9 Score - 1 - -      Assessment & Plan:     Routine Health Maintenance and Physical Exam  Exercise Activities and Dietary recommendations Goals    . DIET - INCREASE WATER INTAKE     Recommend increasing water intake to 4-6 glasses a day.     . Exercise     Starting next week (10/27/16), I will start exercising at the gym 3 days a week for 40 minutes.         Immunization History  Administered Date(s) Administered  . Influenza Split 06/26/2009, 06/10/2011  . Influenza, High Dose Seasonal PF 06/09/2016, 05/19/2018  . Influenza,inj,Quad PF,6+ Mos 05/26/2013  . Influenza-Unspecified 05/10/2015  . Pneumococcal Conjugate-13 12/20/2014  . Pneumococcal Polysaccharide-23 01/06/2012  . Tdap 04/19/2009  . Zoster 01/06/2012    Health Maintenance  Topic Date Due  . Samul Dada  04/20/2019  . COLONOSCOPY  07/26/2019  . INFLUENZA VACCINE  Completed  . Hepatitis C Screening  Completed  . PNA vac Low Risk Adult  Completed     Discussed health benefits of physical activity, and encouraged him to engage in regular exercise appropriate for his age and condition.  1. Essential hypertension  - CBC with Differential/Platelet - Comprehensive metabolic panel  2. Adult hypothyroidism  - TSH  3. Hypercholesterolemia  - Lipid Panel With LDL/HDL Ratio  4. Family history of kidney cancer  - POCT urinalysis dipstick  5. Obstructive sleep apnea syndrome   6. Abdominal aortic aneurysm (AAA) without rupture ([place]) Followed by Vascular.  I have done the exam and reviewed the chart and it is accurate to the best of my knowledge. Development worker, community has been used and  any errors in dictation or transcription are unintentional. [name] M.D. [facility]

## 2018-05-20 DIAGNOSIS — H40023 Open angle with borderline findings, high risk, bilateral: Secondary | ICD-10-CM | POA: Diagnosis not present

## 2018-05-20 LAB — COMPREHENSIVE METABOLIC PANEL
A/G RATIO: 1.4 (ref 1.2–2.2)
ALT: 13 IU/L (ref 0–44)
AST: 17 IU/L (ref 0–40)
Albumin: 4 g/dL (ref 3.5–4.8)
Alkaline Phosphatase: 67 IU/L (ref 39–117)
BILIRUBIN TOTAL: 1.1 mg/dL (ref 0.0–1.2)
BUN/Creatinine Ratio: 11 (ref 10–24)
BUN: 18 mg/dL (ref 8–27)
CALCIUM: 8.9 mg/dL (ref 8.6–10.2)
CHLORIDE: 108 mmol/L — AB (ref 96–106)
CO2: 23 mmol/L (ref 20–29)
Creatinine, Ser: 1.62 mg/dL — ABNORMAL HIGH (ref 0.76–1.27)
GFR, EST AFRICAN AMERICAN: 49 mL/min/{1.73_m2} — AB (ref >59)
GFR, EST NON AFRICAN AMERICAN: 42 mL/min/{1.73_m2} — AB (ref >59)
GLOBULIN, TOTAL: 2.9 g/dL (ref 1.5–4.5)
Glucose: 76 mg/dL (ref 65–99)
Potassium: 4.5 mmol/L (ref 3.5–5.2)
SODIUM: 145 mmol/L — AB (ref 134–144)
TOTAL PROTEIN: 6.9 g/dL (ref 6.0–8.5)

## 2018-05-20 LAB — LIPID PANEL WITH LDL/HDL RATIO
Cholesterol, Total: 189 mg/dL (ref 100–199)
HDL: 34 mg/dL — ABNORMAL LOW (ref >39)
LDL Calculated: 141 mg/dL — ABNORMAL HIGH (ref 0–99)
LDL/HDL RATIO: 4.1 ratio — AB (ref 0.0–3.6)
Triglycerides: 68 mg/dL (ref 0–149)
VLDL Cholesterol Cal: 14 mg/dL (ref 5–40)

## 2018-05-20 LAB — CBC WITH DIFFERENTIAL/PLATELET
Basophils Absolute: 0 10*3/uL (ref 0.0–0.2)
Basos: 1 %
EOS (ABSOLUTE): 0.3 10*3/uL (ref 0.0–0.4)
EOS: 6 %
HEMATOCRIT: 40.1 % (ref 37.5–51.0)
Hemoglobin: 13.5 g/dL (ref 13.0–17.7)
IMMATURE GRANS (ABS): 0 10*3/uL (ref 0.0–0.1)
Immature Granulocytes: 0 %
Lymphocytes Absolute: 1.7 10*3/uL (ref 0.7–3.1)
Lymphs: 29 %
MCH: 31.3 pg (ref 26.6–33.0)
MCHC: 33.7 g/dL (ref 31.5–35.7)
MCV: 93 fL (ref 79–97)
MONOS ABS: 0.4 10*3/uL (ref 0.1–0.9)
Monocytes: 7 %
NEUTROS ABS: 3.3 10*3/uL (ref 1.4–7.0)
Neutrophils: 57 %
PLATELETS: 166 10*3/uL (ref 150–450)
RBC: 4.31 x10E6/uL (ref 4.14–5.80)
RDW: 12.6 % (ref 12.3–15.4)
WBC: 5.8 10*3/uL (ref 3.4–10.8)

## 2018-05-20 LAB — TSH: TSH: 2.93 u[IU]/mL (ref 0.450–4.500)

## 2018-05-22 DIAGNOSIS — I714 Abdominal aortic aneurysm, without rupture, unspecified: Secondary | ICD-10-CM | POA: Insufficient documentation

## 2018-05-24 ENCOUNTER — Telehealth: Payer: Self-pay

## 2018-05-24 MED ORDER — ROSUVASTATIN CALCIUM 20 MG PO TABS: 20.0000 mg | ORAL_TABLET | Freq: Every day | ORAL | 5 refills | Status: DC

## 2018-05-24 NOTE — Telephone Encounter (Signed)
Advised patient of results. Medication was sent into the pharmacy.  

## 2018-05-24 NOTE — Telephone Encounter (Signed)
-----   Message from Jerrol Banana., MD sent at 05/24/2018  8:53 AM EDT ----- Labs stable but lipids a little bit high.  Would change simvastatin 10 to Crestor 20 mg daily.  Recheck 2 months

## 2018-05-31 ENCOUNTER — Other Ambulatory Visit (INDEPENDENT_AMBULATORY_CARE_PROVIDER_SITE_OTHER): Payer: PPO

## 2018-05-31 ENCOUNTER — Encounter (INDEPENDENT_AMBULATORY_CARE_PROVIDER_SITE_OTHER): Payer: PPO | Admitting: Vascular Surgery

## 2018-06-02 ENCOUNTER — Other Ambulatory Visit (INDEPENDENT_AMBULATORY_CARE_PROVIDER_SITE_OTHER): Payer: PPO

## 2018-06-02 ENCOUNTER — Encounter (INDEPENDENT_AMBULATORY_CARE_PROVIDER_SITE_OTHER): Payer: PPO | Admitting: Nurse Practitioner

## 2018-06-16 ENCOUNTER — Encounter (INDEPENDENT_AMBULATORY_CARE_PROVIDER_SITE_OTHER): Payer: Self-pay | Admitting: Nurse Practitioner

## 2018-06-16 ENCOUNTER — Ambulatory Visit (INDEPENDENT_AMBULATORY_CARE_PROVIDER_SITE_OTHER): Payer: PPO | Admitting: Nurse Practitioner

## 2018-06-16 ENCOUNTER — Other Ambulatory Visit (INDEPENDENT_AMBULATORY_CARE_PROVIDER_SITE_OTHER): Payer: Self-pay | Admitting: Nurse Practitioner

## 2018-06-16 ENCOUNTER — Ambulatory Visit (INDEPENDENT_AMBULATORY_CARE_PROVIDER_SITE_OTHER): Payer: PPO

## 2018-06-16 VITALS — BP 132/74 | HR 41 | Resp 15 | Ht 70.0 in | Wt 208.6 lb

## 2018-06-16 DIAGNOSIS — I714 Abdominal aortic aneurysm, without rupture, unspecified: Secondary | ICD-10-CM

## 2018-06-16 DIAGNOSIS — I10 Essential (primary) hypertension: Secondary | ICD-10-CM | POA: Diagnosis not present

## 2018-06-16 DIAGNOSIS — E7800 Pure hypercholesterolemia, unspecified: Secondary | ICD-10-CM

## 2018-06-16 DIAGNOSIS — I723 Aneurysm of iliac artery: Secondary | ICD-10-CM | POA: Diagnosis not present

## 2018-06-16 NOTE — Progress Notes (Signed)
Subjective:    Patient ID: Larry Hernandez, male    DOB: 13-Jan-1946, 72 y.o.   MRN: [id number] Chief Complaint  Patient presents with  . New Admit To SNF    ref [facility] for AAA    HPI  Larry Hernandez is a 72 y.o. male that presents to the office for evaluation of an abdominal aortic aneurysm. The aneurysm was found incidentally at [facility] in 2017.  The patient has a past medical history of hypertension and hyperlipidemia.  Patient denies abdominal pain or unusual back pain, no other abdominal complaints.  No history of an acute onset of painful blue discoloration of the toes.     No family history of AAA.   Patient denies amaurosis fugax or TIA symptoms. There is no history of claudication or rest pain symptoms of the lower extremities.  The patient denies angina or shortness of breath.  Aortic iliac duplex shows a AAA that measures 3.46 centimeters.  The patient has a right common iliac aneurysm at 2.5 cm and a left common iliac aneurysm at 1.9 cm.  Compared to the previous study on 06/06/2016 there is little change, with the aorta measuring 3.7 cm, right common iliac measuring 2.2 cm and left common iliac measuring 1.8 cm.  Patient denies nausea, fever, vomiting, and diarrhea.  Patient denies any chest pain or shortness of breath.  Review of Systems   Review of Systems: Negative Unless Checked Constitutional: [] Weight loss  [] Fever  [] Chills Cardiac: [] Chest pain   ? Atrial Fibrillation  [] Palpitations   [] Shortness of breath when laying flat   [] Shortness of breath with exertion. Vascular:  [] Pain in legs with walking   [] Pain in legs with standing  [] History of DVT   [] Phlebitis   [] Swelling in legs   [] Varicose veins   [] Non-healing ulcers Pulmonary:   [] Uses home oxygen   [] Productive cough   [] Hemoptysis   [] Wheeze  [] COPD   [] Asthma Neurologic:  [] Dizziness   [] Seizures   [] History of stroke   [] History of TIA  [] Aphasia   [] Vissual changes   [] Weakness or numbness in arm    [] Weakness or numbness in leg Musculoskeletal:   [] Joint swelling   [] Joint pain   [] Low back pain  ? History of Knee Replacement Hematologic:  [] Easy bruising  [] Easy bleeding   [] Hypercoagulable state   [] Anemic Gastrointestinal:  [] Diarrhea   [] Vomiting  [] Gastroesophageal reflux/heartburn   [] Difficulty swallowing. Genitourinary:  [] Chronic kidney disease   [] Difficult urination  [] Anuric   [] Blood in urine Skin:  [] Rashes   [] Ulcers  Psychological:  [] History of anxiety   []  History of major depression  ? Memory Difficulties     Objective:   Physical Exam  BP 132/74 (BP Location: Right Arm)   Pulse (!) 41   Resp 15   Ht 5\' 10"  (1.778 m)   Wt 208 lb 9.6 oz (94.6 kg)   BMI 29.93 kg/m   Past Medical History:  Diagnosis Date  . Cancer [facility]) 2012   skin caner face and ear  . Hernia, inguinal   . Hyperlipidemia   . Hypertension   . Hypothyroid      Gen: WD/WN, NAD, flat affect Head: [place]/AT, No temporalis wasting.  Ear/Nose/Throat: Hearing grossly intact, nares w/o erythema or drainage Eyes: PER, EOMI, sclera nonicteric.  Neck: Supple, no masses.  No JVD.  Pulmonary:  Good air movement, no use of accessory muscles.  Cardiac: RRR Vascular:  Vessel Right Left  Radial  Palpable Palpable  Popliteal  non palpable Non Palpable  Dorsalis Pedis Palpable Palpable  Posterior Tibial Palpable Palpable   Gastrointestinal: soft, non-distended. No guarding/no peritoneal signs.  Musculoskeletal: M/S 5/5 throughout.  No deformity or atrophy.  Neurologic: Pain and light touch intact in extremities.  Symmetrical.  Speech is fluent. Motor exam as listed above. Psychiatric: Judgment intact, Mood & affect appropriate for pt's clinical situation. Dermatologic: No Venous rashes. No Ulcers Noted.  No changes consistent with cellulitis. Lymph : No Cervical lymphadenopathy, no lichenification or skin changes of chronic lymphedema.   Social History   Socioeconomic History  . Marital status:  Married    Spouse name: Not on file  . Number of children: 1  . Years of education: Not on file  . Highest education level: Bachelor's degree (e.g., BA, AB, BS)  Occupational History  . 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 Financial resource strain: Not hard at all  . Food insecurity:    Worry: Never true    Inability: Never true  . Transportation needs:    Medical: No    Non-medical: No  Tobacco Use  . Smoking status: Never Smoker  . Smokeless tobacco: Never Used  Substance and Sexual Activity  . Alcohol use: No    Alcohol/week: 0.0 standard drinks  . Drug use: No  . Sexual activity: Not on file  Lifestyle  . Physical activity:    Days per week: Not on file    Minutes per session: Not on file  . Stress: Only a little  Relationships  . Social connections:    Talks on phone: Not on file    Gets together: Not on file    Attends religious service: Not on file    Active member of club or organization: Not on file    Attends meetings of clubs or organizations: Not on file    Relationship status: Not on file  . Intimate partner violence:    Fear of current or ex partner: Not on file    Emotionally abused: Not on file    Physically abused: Not on file    Forced sexual activity: Not on file  Other Topics Concern  . Not on file  Social History Narrative  . Not on file    Past Surgical History:  Procedure Laterality Date  . BASAL CELL CARCINOMA EXCISION    . COLONOSCOPY  2012  . HERNIA REPAIR Left 06-13-14   inguinal    Family History  Problem Relation Age of Onset  . Kidney disease Father        died @ 60  . Cancer Father        kidney, was removed   . Heart disease Brother        died @ age 48    No Known Allergies     Assessment & Plan:   1. Abdominal aortic aneurysm (AAA) without rupture ([place]) No surgery or intervention at this time. The patient has an asymptomatic abdominal aortic aneurysm that is less than 4 cm in  maximal diameter.  I have discussed the natural history of abdominal aortic aneurysm and the small risk of rupture for aneurysm less than 5 cm in size.  However, as these small aneurysms tend to enlarge over time, continued surveillance with ultrasound or CT scan is mandatory.   I have also discussed optimizing medical management with hypertension and lipid control and the importance of abstinence from tobacco.  The patient is also encouraged to exercise a minimum of 30 minutes 4 times a week.   Should the patient develop new onset abdominal or back pain or signs of peripheral embolization they are instructed to seek medical attention immediately and to alert the physician providing care that they have an aneurysm.  The patient voices their understanding. The patient will return in 6 months with an aortic duplex.  - VAS US AORTA/IVC/ILIACS; Future  2. Iliac artery aneurysm ([facility]) No surgery or intervention at this time.  The patient has asymptomatic bilateral common iliac artery aneurysms that are less than 3 cm in maximal diameter.  I have discussed the natural history of common iliac aneurysms and the small risk of thrombosis for aneurysm less than 3 cm in size.  However, as these small aneurysms tend to enlarge over time, continued surveillance with ultrasound is mandatory.   I have also discussed optimizing medical management with hypertension and lipid control and the importance of abstinence from tobacco.  The patient is also encouraged to exercise a minimum of 30 minutes 4 times a week.   Should the patient develop new leg pain or signs of peripheral embolization they are instructed to seek medical attention immediately and to alert the physician providing care that they have an aneurysm.  The patient voices their understanding.  - VAS US AORTA/IVC/ILIACS; Future  3. Hypercholesterolemia Continue statin as ordered and reviewed, no changes at this time   4. Essential  hypertension Continue antihypertensive medications as already ordered, these medications have been reviewed and there are no changes at this time.     Current Outpatient Medications on File Prior to Visit  Medication Sig Dispense Refill  . amLODipine (NORVASC) 5 MG tablet TAKE 1 TABLET(5 MG) BY MOUTH DAILY 90 tablet 3  . levothyroxine (SYNTHROID, LEVOTHROID) 75 MCG tablet TAKE 1 TABLET BY MOUTH DAILY 90 tablet 3  . losartan (COZAAR) 50 MG tablet TAKE 1 TABLET(50 MG) BY MOUTH DAILY 90 tablet 3  . rosuvastatin (CRESTOR) 20 MG tablet Take 1 tablet (20 mg total) by mouth daily. 30 tablet 5  . sildenafil (REVATIO) 20 MG tablet Take 1 tablet (20 mg total) by mouth See admin instructions. 1-5 daily as needed 50 tablet 11   No current facility-administered medications on file prior to visit.     There are no Patient Instructions on file for this visit. Return in about 6 months (around 12/16/2018).   Kris Hartmann, NP

## 2018-06-21 ENCOUNTER — Encounter (INDEPENDENT_AMBULATORY_CARE_PROVIDER_SITE_OTHER): Payer: Self-pay | Admitting: Nurse Practitioner

## 2018-06-22 NOTE — Progress Notes (Signed)
Made a copy of the patient's questionare and gave it to boss to look into further.

## 2018-08-01 ENCOUNTER — Other Ambulatory Visit: Payer: Self-pay | Admitting: Family Medicine

## 2018-08-01 DIAGNOSIS — I10 Essential (primary) hypertension: Secondary | ICD-10-CM

## 2018-11-18 DIAGNOSIS — H2513 Age-related nuclear cataract, bilateral: Secondary | ICD-10-CM | POA: Diagnosis not present

## 2018-11-18 DIAGNOSIS — H52223 Regular astigmatism, bilateral: Secondary | ICD-10-CM | POA: Diagnosis not present

## 2018-11-18 DIAGNOSIS — H353131 Nonexudative age-related macular degeneration, bilateral, early dry stage: Secondary | ICD-10-CM | POA: Diagnosis not present

## 2018-11-18 DIAGNOSIS — H40023 Open angle with borderline findings, high risk, bilateral: Secondary | ICD-10-CM | POA: Diagnosis not present

## 2018-11-18 DIAGNOSIS — H524 Presbyopia: Secondary | ICD-10-CM | POA: Diagnosis not present

## 2018-11-19 ENCOUNTER — Other Ambulatory Visit: Payer: Self-pay | Admitting: Family Medicine

## 2018-11-19 MED ORDER — ROSUVASTATIN CALCIUM 20 MG PO TABS: 20.0000 mg | ORAL_TABLET | Freq: Every day | ORAL | 5 refills | Status: DC

## 2018-11-19 NOTE — Telephone Encounter (Signed)
[facility] faxed refill request for the following medications:  rosuvastatin (CRESTOR) 20 MG tablet  Please advise. Thanks TNP

## 2018-12-04 ENCOUNTER — Encounter (INDEPENDENT_AMBULATORY_CARE_PROVIDER_SITE_OTHER): Payer: Self-pay

## 2018-12-16 ENCOUNTER — Ambulatory Visit (INDEPENDENT_AMBULATORY_CARE_PROVIDER_SITE_OTHER): Payer: Medicare Other | Admitting: Nurse Practitioner

## 2018-12-16 ENCOUNTER — Encounter (INDEPENDENT_AMBULATORY_CARE_PROVIDER_SITE_OTHER): Payer: Medicare Other

## 2019-01-14 ENCOUNTER — Other Ambulatory Visit (INDEPENDENT_AMBULATORY_CARE_PROVIDER_SITE_OTHER): Payer: Self-pay | Admitting: Nurse Practitioner

## 2019-01-14 DIAGNOSIS — I714 Abdominal aortic aneurysm, without rupture, unspecified: Secondary | ICD-10-CM

## 2019-01-17 ENCOUNTER — Encounter (INDEPENDENT_AMBULATORY_CARE_PROVIDER_SITE_OTHER): Payer: Self-pay | Admitting: Nurse Practitioner

## 2019-01-17 ENCOUNTER — Other Ambulatory Visit: Payer: Self-pay

## 2019-01-17 ENCOUNTER — Ambulatory Visit (INDEPENDENT_AMBULATORY_CARE_PROVIDER_SITE_OTHER): Payer: PPO

## 2019-01-17 ENCOUNTER — Ambulatory Visit (INDEPENDENT_AMBULATORY_CARE_PROVIDER_SITE_OTHER): Payer: PPO | Admitting: Nurse Practitioner

## 2019-01-17 VITALS — BP 148/77 | HR 56 | Resp 16 | Ht 70.0 in | Wt 210.6 lb

## 2019-01-17 DIAGNOSIS — I714 Abdominal aortic aneurysm, without rupture, unspecified: Secondary | ICD-10-CM

## 2019-01-17 DIAGNOSIS — I723 Aneurysm of iliac artery: Secondary | ICD-10-CM

## 2019-01-17 DIAGNOSIS — E7800 Pure hypercholesterolemia, unspecified: Secondary | ICD-10-CM

## 2019-01-17 DIAGNOSIS — I10 Essential (primary) hypertension: Secondary | ICD-10-CM

## 2019-01-17 DIAGNOSIS — Z79899 Other long term (current) drug therapy: Secondary | ICD-10-CM | POA: Diagnosis not present

## 2019-01-17 NOTE — Progress Notes (Signed)
SUBJECTIVE:  Patient ID: Larry Hernandez, male    DOB: [birth year], 73 y.o.   MRN: [id number] Chief Complaint  Patient presents with  . Follow-up    63month ultrasound follow up    HPI  Larry Hernandez is a 73 y.o. male that presents today for follow-up of known abdominal aortic aneurysms as well as iliac artery aneurysms.  The patient denies any major health changes over the last 6 months.  He denies any fevers, chills, nausea, vomiting or diarrhea.  He denies any chest pain or shortness of breath.  He denies any TIA-like symptoms.  Today the patient underwent noninvasive studies which revealed a abdominal aortic aneurysm at 3.3 cm.  The right proximal common iliac is dilated 2.0 cm with the left common iliac at 1.5 cm.  This is essentially unchanged from previous exam on 06/16/2018.  Past Medical History:  Diagnosis Date  . Cancer [facility]) 2012   skin caner face and ear  . Hernia, inguinal   . Hyperlipidemia   . Hypertension   . Hypothyroid     Past Surgical History:  Procedure Laterality Date  . BASAL CELL CARCINOMA EXCISION    . COLONOSCOPY  2012  . HERNIA REPAIR Left 06-13-14   inguinal    Social History   Socioeconomic History  . Marital status: Married    Spouse name: Not on file  . Number of children: 1  . Years of education: Not on file  . Highest education level: Bachelor's degree (e.g., BA, AB, BS)  Occupational History  . 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 Financial resource strain: Not hard at all  . Food insecurity:    Worry: Never true    Inability: Never true  . Transportation needs:    Medical: No    Non-medical: No  Tobacco Use  . Smoking status: Never Smoker  . Smokeless tobacco: Never Used  Substance and Sexual Activity  . Alcohol use: No    Alcohol/week: 0.0 standard drinks  . Drug use: No  . Sexual activity: Not on file  Lifestyle  . Physical activity:    Days per week: Not on file    Minutes per  session: Not on file  . Stress: Only a little  Relationships  . Social connections:    Talks on phone: Not on file    Gets together: Not on file    Attends religious service: Not on file    Active member of club or organization: Not on file    Attends meetings of clubs or organizations: Not on file    Relationship status: Not on file  . Intimate partner violence:    Fear of current or ex partner: Not on file    Emotionally abused: Not on file    Physically abused: Not on file    Forced sexual activity: Not on file  Other Topics Concern  . Not on file  Social History Narrative  . Not on file    Family History  Problem Relation Age of Onset  . Kidney disease Father        died @ 20  . Cancer Father        kidney, was removed   . Heart disease Brother        died @ age 69    No Known Allergies   Review of Systems   Review of Systems: Negative Unless Checked Constitutional: [] Weight loss  [] Fever  []   Chills Cardiac: [] Chest pain   []  Atrial Fibrillation  [] Palpitations   [] Shortness of breath when laying flat   [] Shortness of breath with exertion. [] Shortness of breath at rest Vascular:  [] Pain in legs with walking   [] Pain in legs with standing [] Pain in legs when laying flat   [] Claudication    [] Pain in feet when laying flat    [] History of DVT   [] Phlebitis   [] Swelling in legs   [] Varicose veins   [] Non-healing ulcers Pulmonary:   [] Uses home oxygen   [] Productive cough   [] Hemoptysis   [] Wheeze  [] COPD   [] Asthma Neurologic:  [] Dizziness   [] Seizures  [] Blackouts [] History of stroke   [] History of TIA  [] Aphasia   [] Temporary Blindness   [] Weakness or numbness in arm   [] Weakness or numbness in leg Musculoskeletal:   [] Joint swelling   [] Joint pain   [] Low back pain  []  History of Knee Replacement [x] Arthritis [] back Surgeries  []  Spinal Stenosis    Hematologic:  [] Easy bruising  [] Easy bleeding   [] Hypercoagulable state   [] Anemic Gastrointestinal:  [] Diarrhea    [] Vomiting  [] Gastroesophageal reflux/heartburn   [] Difficulty swallowing. [] Abdominal pain Genitourinary:  [] Chronic kidney disease   [] Difficult urination  [] Anuric   [] Blood in urine [] Frequent urination  [] Burning with urination   [] Hematuria Skin:  [] Rashes   [] Ulcers [] Wounds Psychological:  [] History of anxiety   []  History of major depression  []  Memory Difficulties      OBJECTIVE:   Physical Exam  BP (!) 148/77 (BP Location: Right Arm)   Pulse (!) 56   Resp 16   Ht 5\' 10"  (1.778 m)   Wt 210 lb 9.6 oz (95.5 kg)   BMI 30.22 kg/m   Gen: WD/WN, NAD Head: [place]/AT, No temporalis wasting.  Ear/Nose/Throat: Hearing grossly intact, nares w/o erythema or drainage Eyes: PER, EOMI, sclera nonicteric.  Neck: Supple, no masses.  No JVD.  Pulmonary:  Good air movement, no use of accessory muscles.  Cardiac: RRR Vascular:  Vessel Right Left  Radial Palpable Palpable  Dorsalis Pedis Palpable Palpable  Posterior Tibial Palpable Palpable   Gastrointestinal: soft, non-distended. No guarding/no peritoneal signs.  Musculoskeletal: M/S 5/5 throughout.  No deformity or atrophy.  Neurologic: Pain and light touch intact in extremities.  Symmetrical.  Speech is fluent. Motor exam as listed above. Psychiatric: Judgment intact, Mood & affect appropriate for pt's clinical situation. Dermatologic: No Venous rashes. No Ulcers Noted.  No changes consistent with cellulitis. Lymph : No Cervical lymphadenopathy, no lichenification or skin changes of chronic lymphedema.       ASSESSMENT AND PLAN:  1. Abdominal aortic aneurysm (AAA) without rupture ([place]) No surgery or intervention at this time. The patient has an asymptomatic abdominal aortic aneurysm that is less than 4 cm in maximal diameter.  I have discussed the natural history of abdominal aortic aneurysm and the small risk of rupture for aneurysm less than 5 cm in size.  However, as these small aneurysms tend to enlarge over time, continued  surveillance with ultrasound or CT scan is mandatory.  I have also discussed optimizing medical management with hypertension and lipid control and the importance of abstinence from tobacco.  The patient is also encouraged to exercise a minimum of 30 minutes 4 times a week.  Should the patient develop new onset abdominal or back pain or signs of peripheral embolization they are instructed to seek medical attention immediately and to alert the physician providing care that they have an  aneurysm.  The patient voices their understanding. The patient will return in 24 months with an aortic duplex, per patient preference.  - VAS US AORTA/IVC/ILIACS; Future  2. Iliac artery aneurysm (HCC) No surgery or intervention at this time.  Typically we do not intervene on iliac aneurysms until they reach at least 3 cm in size. - VAS US AORTA/IVC/ILIACS; Future  3. Essential hypertension Continue antihypertensive medications as already ordered, these medications have been reviewed and there are no changes at this time.   4. Hypercholesterolemia Continue statin as ordered and reviewed, no changes at this time    Current Outpatient Medications on File Prior to Visit  Medication Sig Dispense Refill  . amLODipine (NORVASC) 5 MG tablet TAKE 1 TABLET(5 MG) BY MOUTH DAILY 90 tablet 3  . levothyroxine (SYNTHROID, LEVOTHROID) 75 MCG tablet TAKE 1 TABLET BY MOUTH DAILY 90 tablet 3  . losartan (COZAAR) 50 MG tablet TAKE 1 TABLET(50 MG) BY MOUTH DAILY 90 tablet 3  . rosuvastatin (CRESTOR) 20 MG tablet Take 1 tablet (20 mg total) by mouth daily. 30 tablet 5  . sildenafil (REVATIO) 20 MG tablet Take 1 tablet (20 mg total) by mouth See admin instructions. 1-5 daily as needed 50 tablet 11   No current facility-administered medications on file prior to visit.     There are no Patient Instructions on file for this visit. Return in about 2 years (around 01/16/2021) for AAA Follow Up.   Kris Hartmann, NP  This note  was completed with Sales executive.  Any errors are purely unintentional.

## 2019-02-14 DIAGNOSIS — D225 Melanocytic nevi of trunk: Secondary | ICD-10-CM | POA: Diagnosis not present

## 2019-02-14 DIAGNOSIS — X32XXXA Exposure to sunlight, initial encounter: Secondary | ICD-10-CM | POA: Diagnosis not present

## 2019-02-14 DIAGNOSIS — D2272 Melanocytic nevi of left lower limb, including hip: Secondary | ICD-10-CM | POA: Diagnosis not present

## 2019-02-14 DIAGNOSIS — Z85828 Personal history of other malignant neoplasm of skin: Secondary | ICD-10-CM | POA: Diagnosis not present

## 2019-02-14 DIAGNOSIS — L570 Actinic keratosis: Secondary | ICD-10-CM | POA: Diagnosis not present

## 2019-02-14 DIAGNOSIS — D2271 Melanocytic nevi of right lower limb, including hip: Secondary | ICD-10-CM | POA: Diagnosis not present

## 2019-02-14 DIAGNOSIS — D2261 Melanocytic nevi of right upper limb, including shoulder: Secondary | ICD-10-CM | POA: Diagnosis not present

## 2019-02-14 DIAGNOSIS — Z08 Encounter for follow-up examination after completed treatment for malignant neoplasm: Secondary | ICD-10-CM | POA: Diagnosis not present

## 2019-02-14 DIAGNOSIS — D2262 Melanocytic nevi of left upper limb, including shoulder: Secondary | ICD-10-CM | POA: Diagnosis not present

## 2019-02-17 ENCOUNTER — Other Ambulatory Visit: Payer: Self-pay | Admitting: Family Medicine

## 2019-02-17 DIAGNOSIS — I10 Essential (primary) hypertension: Secondary | ICD-10-CM

## 2019-05-01 ENCOUNTER — Other Ambulatory Visit: Payer: Self-pay | Admitting: Family Medicine

## 2019-05-01 DIAGNOSIS — E039 Hypothyroidism, unspecified: Secondary | ICD-10-CM

## 2019-05-16 ENCOUNTER — Other Ambulatory Visit: Payer: Self-pay | Admitting: Family Medicine

## 2019-05-24 ENCOUNTER — Ambulatory Visit: Payer: PPO

## 2019-05-24 ENCOUNTER — Other Ambulatory Visit: Payer: Self-pay

## 2019-05-24 ENCOUNTER — Encounter: Payer: Self-pay | Admitting: Family Medicine

## 2019-05-24 ENCOUNTER — Ambulatory Visit (INDEPENDENT_AMBULATORY_CARE_PROVIDER_SITE_OTHER): Payer: PPO | Admitting: Family Medicine

## 2019-05-24 VITALS — BP 136/64 | HR 52 | Temp 98.6°F | Resp 16 | Ht 70.0 in | Wt 206.0 lb

## 2019-05-24 DIAGNOSIS — Z0000 Encounter for general adult medical examination without abnormal findings: Secondary | ICD-10-CM | POA: Diagnosis not present

## 2019-05-24 DIAGNOSIS — E7800 Pure hypercholesterolemia, unspecified: Secondary | ICD-10-CM | POA: Diagnosis not present

## 2019-05-24 DIAGNOSIS — I10 Essential (primary) hypertension: Secondary | ICD-10-CM | POA: Diagnosis not present

## 2019-05-24 DIAGNOSIS — Z23 Encounter for immunization: Secondary | ICD-10-CM | POA: Diagnosis not present

## 2019-05-24 DIAGNOSIS — Z1211 Encounter for screening for malignant neoplasm of colon: Secondary | ICD-10-CM | POA: Diagnosis not present

## 2019-05-24 DIAGNOSIS — E039 Hypothyroidism, unspecified: Secondary | ICD-10-CM

## 2019-05-24 DIAGNOSIS — R001 Bradycardia, unspecified: Secondary | ICD-10-CM | POA: Diagnosis not present

## 2019-05-24 LAB — EKG 12-LEAD

## 2019-05-24 NOTE — Progress Notes (Signed)
Patient: Larry Hernandez, Male    DOB: 1946-01-26, 73 y.o.   MRN: [id number] Visit Date: 05/24/2019  Today's Provider: Wilhemena Durie, MD   Chief Complaint  Patient presents with  . Annual Exam   Subjective:     Annual physical visit Larry Hernandez is a 73 y.o. male. He feels well. He reports exercising not regularly, but he does stay active. He reports he is sleeping well. He has occasional dizziness ,no syncope.,only lasts a few seconds. Pt exercises regularly. Colonoscopy- 07/25/2009. Dr. Candace Cruise. Repeat in 10 yrs. Normal.   Immunization History  Administered Date(s) Administered  . Influenza Split 06/26/2009, 06/10/2011  . Influenza, High Dose Seasonal PF 06/09/2016, 05/19/2018  . Influenza,inj,Quad PF,6+ Mos 05/26/2013  . Influenza-Unspecified 05/10/2015  . Pneumococcal Conjugate-13 12/20/2014  . Pneumococcal Polysaccharide-23 01/06/2012  . Tdap 04/19/2009  . Zoster 01/06/2012      Review of Systems  Constitutional: Negative.   HENT: Negative.   Eyes: Negative.   Respiratory: Negative.   Cardiovascular: Negative.   Gastrointestinal: Negative.   Endocrine: Negative.   Genitourinary: Negative.   Musculoskeletal: Negative.   Skin: Negative.   Allergic/Immunologic: Negative.   Neurological: Positive for dizziness.  Hematological: Negative.   Psychiatric/Behavioral: Negative.     Social History   Socioeconomic History  . Marital status: Married    Spouse name: Not on file  . Number of children: 1  . Years of education: Not on file  . Highest education level: Bachelor's degree (e.g., BA, AB, BS)  Occupational History  . 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 Financial resource strain: Not hard at all  . Food insecurity    Worry: Never true    Inability: Never true  . Transportation needs    Medical: No    Non-medical: No  Tobacco Use  . Smoking status: Never Smoker  . Smokeless tobacco: Never Used  Substance  and Sexual Activity  . Alcohol use: No    Alcohol/week: 0.0 standard drinks  . Drug use: No  . Sexual activity: Not on file  Lifestyle  . Physical activity    Days per week: Not on file    Minutes per session: Not on file  . Stress: Only a little  Relationships  . Social Herbalist on phone: Not on file    Gets together: Not on file    Attends religious service: Not on file    Active member of club or organization: Not on file    Attends meetings of clubs or organizations: Not on file    Relationship status: Not on file  . Intimate partner violence    Fear of current or ex partner: Not on file    Emotionally abused: Not on file    Physically abused: Not on file    Forced sexual activity: Not on file  Other Topics Concern  . Not on file  Social History Narrative  . Not on file    Past Medical History:  Diagnosis Date  . Cancer [facility]) 2012   skin caner face and ear  . Hernia, inguinal   . Hyperlipidemia   . Hypertension   . Hypothyroid      Patient Active Problem List   Diagnosis Date Noted  . Iliac artery aneurysm ([place]) 06/16/2018  . AAA (abdominal aortic aneurysm) ([place]) 05/22/2018  . Actinic keratosis 06/26/2015  . ED (erectile dysfunction) of organic origin  06/26/2015  . Hypercholesterolemia 06/26/2015  . Adiposity 11/17/2009  . Apnea, sleep 11/17/2009  . Urinary system disease 10/23/2009  . Adult hypothyroidism 10/23/2009  . Hypertension 05/22/2009    Past Surgical History:  Procedure Laterality Date  . BASAL CELL CARCINOMA EXCISION    . COLONOSCOPY  2012  . HERNIA REPAIR Left 06-13-14   inguinal    His family history includes Cancer in his father; Heart disease in his brother; Kidney disease in his father.   Current Outpatient Medications:  .  amLODipine (NORVASC) 5 MG tablet, TAKE 1 TABLET(5 MG) BY MOUTH DAILY, Disp: 90 tablet, Rfl: 3 .  levothyroxine (SYNTHROID) 75 MCG tablet, TAKE 1 TABLET BY MOUTH DAILY, Disp: 90 tablet, Rfl: 3 .   losartan (COZAAR) 50 MG tablet, TAKE 1 TABLET(50 MG) BY MOUTH DAILY, Disp: 90 tablet, Rfl: 3 .  rosuvastatin (CRESTOR) 20 MG tablet, TAKE 1 TABLET(20 MG) BY MOUTH DAILY, Disp: 30 tablet, Rfl: 5 .  sildenafil (REVATIO) 20 MG tablet, Take 1 tablet (20 mg total) by mouth See admin instructions. 1-5 daily as needed, Disp: 50 tablet, Rfl: 11  Patient Care Team: Jerrol Banana., MD as PCP - General (Family Medicine) Thelma Comp, OD as Consulting Physician (Optometry) Dasher, Rayvon Char, MD as Consulting Physician (Dermatology)    Objective:    Vitals: BP 136/64   Pulse (!) 52   Temp 98.6 F (37 C)   Resp 16   Ht 5\' 10"  (1.778 m)   Wt 206 lb (93.4 kg)   SpO2 96%   BMI 29.56 kg/m   Physical Exam Vitals signs reviewed.  Constitutional:      Appearance: He is well-developed.  HENT:     Head: Normocephalic and atraumatic.     Right Ear: External ear normal.     Left Ear: External ear normal.     Nose: Nose normal.  Eyes:     Conjunctiva/sclera: Conjunctivae normal.     Pupils: Pupils are equal, round, and reactive to light.  Neck:     Musculoskeletal: Normal range of motion and neck supple.  Cardiovascular:     Rate and Rhythm: Normal rate and regular rhythm.     Heart sounds: Normal heart sounds.  Pulmonary:     Effort: Pulmonary effort is normal.     Breath sounds: Normal breath sounds.  Abdominal:     General: Bowel sounds are normal.     Palpations: Abdomen is soft.  Genitourinary:    Penis: Normal.      Prostate: Normal.     Rectum: Normal.  Musculoskeletal: Normal range of motion.  Skin:    General: Skin is warm and dry.  Neurological:     General: No focal deficit present.     Mental Status: He is alert and oriented to person, place, and time.  Psychiatric:        Mood and Affect: Mood normal.        Behavior: Behavior normal.        Thought Content: Thought content normal.        Judgment: Judgment normal.   ECG--sinus bradycardia with RBBB   Activities of Daily Living In your present state of health, do you have any difficulty performing the following activities: 05/24/2019  Hearing? Y  Vision? N  Difficulty concentrating or making decisions? N  Walking or climbing stairs? N  Dressing or bathing? N  Doing errands, shopping? N  Some recent data might be hidden    Fall Risk  Assessment Fall Risk  05/24/2019 05/19/2018 02/10/2018 10/22/2016 06/26/2015  Falls in the past year? 0 No No No No  Number falls in past yr: 0 - - - -  Injury with Fall? 0 - - - -  Follow up Falls evaluation completed - - - -     Depression Screen PHQ 2/9 Scores 05/24/2019 05/19/2018 02/10/2018 10/22/2016  PHQ - 2 Score 0 0 0 0  PHQ- 9 Score - - 1 -    6CIT Screen 05/24/2019  What Year? 0 points  What month? 0 points  What time? 0 points  Count back from 20 0 points  Months in reverse 0 points  Repeat phrase 0 points  Total Score 0      Assessment & Plan:     Annual Wellness Visit  Reviewed patient's Family Medical History Reviewed and updated list of patient's medical providers Assessment of cognitive impairment was done Assessed patient's functional ability Established a written schedule for health screening [place] Completed and Reviewed  Exercise Activities and Dietary recommendations Goals    . DIET - INCREASE WATER INTAKE     Recommend increasing water intake to 4-6 glasses a day.     . Exercise     Starting next week (10/27/16), I will start exercising at the gym 3 days a week for 40 minutes.        Immunization History  Administered Date(s) Administered  . Influenza Split 06/26/2009, 06/10/2011  . Influenza, High Dose Seasonal PF 06/09/2016, 05/19/2018  . Influenza,inj,Quad PF,6+ Mos 05/26/2013  . Influenza-Unspecified 05/10/2015  . Pneumococcal Conjugate-13 12/20/2014  . Pneumococcal Polysaccharide-23 01/06/2012  . Tdap 04/19/2009  . Zoster 01/06/2012    Health Maintenance  Topic Date Due  .  INFLUENZA VACCINE  04/09/2019  . TETANUS/TDAP  04/20/2019  . COLONOSCOPY  07/26/2019  . Hepatitis C Screening  Completed  . PNA vac Low Risk Adult  Completed     Discussed health benefits of physical activity, and encouraged him to engage in regular exercise appropriate for his age and condition.  1. Annual physical exam RTC 1 year.  2. Bradycardia ECG inchanged from 2015. - EKG 12-Lead  3. Encounter for screening colonoscopy Last 2010. - Ambulatory referral to Gastroenterology  4. Essential hypertension On ARB/CCB - CBC w/Diff/Platelet - Comp. Metabolic Panel (12)  5. Hypercholesterolemia On crestor. - Comp. Metabolic Panel (12) - Lipid Profile  6. Adult hypothyroidism  - TSH    I, Rachelle L. Presley, CMA, am acting as a Education administrator for [employer]. Rosanna Randy, [place]    Wilhemena Durie, MD  [facility]

## 2019-05-25 LAB — CBC WITH DIFFERENTIAL/PLATELET
Basophils Absolute: 0 10*3/uL (ref 0.0–0.2)
Basos: 0 %
EOS (ABSOLUTE): 0.3 10*3/uL (ref 0.0–0.4)
Eos: 5 %
Hematocrit: 40.9 % (ref 37.5–51.0)
Hemoglobin: 13.4 g/dL (ref 13.0–17.7)
Immature Grans (Abs): 0 10*3/uL (ref 0.0–0.1)
Immature Granulocytes: 0 %
Lymphocytes Absolute: 1.5 10*3/uL (ref 0.7–3.1)
Lymphs: 27 %
MCH: 31.3 pg (ref 26.6–33.0)
MCHC: 32.8 g/dL (ref 31.5–35.7)
MCV: 96 fL (ref 79–97)
Monocytes Absolute: 0.4 10*3/uL (ref 0.1–0.9)
Monocytes: 7 %
Neutrophils Absolute: 3.3 10*3/uL (ref 1.4–7.0)
Neutrophils: 61 %
Platelets: 128 10*3/uL — ABNORMAL LOW (ref 150–450)
RBC: 4.28 x10E6/uL (ref 4.14–5.80)
RDW: 12.9 % (ref 11.6–15.4)
WBC: 5.5 10*3/uL (ref 3.4–10.8)

## 2019-05-25 LAB — COMP. METABOLIC PANEL (12)
AST: 14 IU/L (ref 0–40)
Albumin/Globulin Ratio: 1.6 (ref 1.2–2.2)
Albumin: 3.9 g/dL (ref 3.7–4.7)
Alkaline Phosphatase: 67 IU/L (ref 39–117)
BUN/Creatinine Ratio: 12 (ref 10–24)
BUN: 20 mg/dL (ref 8–27)
Bilirubin Total: 1 mg/dL (ref 0.0–1.2)
Calcium: 8.7 mg/dL (ref 8.6–10.2)
Chloride: 109 mmol/L — ABNORMAL HIGH (ref 96–106)
Creatinine, Ser: 1.68 mg/dL — ABNORMAL HIGH (ref 0.76–1.27)
GFR calc Af Amer: 46 mL/min/{1.73_m2} — ABNORMAL LOW (ref >59)
GFR calc non Af Amer: 40 mL/min/{1.73_m2} — ABNORMAL LOW (ref >59)
Globulin, Total: 2.4 g/dL (ref 1.5–4.5)
Glucose: 85 mg/dL (ref 65–99)
Potassium: 4.5 mmol/L (ref 3.5–5.2)
Sodium: 144 mmol/L (ref 134–144)
Total Protein: 6.3 g/dL (ref 6.0–8.5)

## 2019-05-25 LAB — LIPID PANEL
Chol/HDL Ratio: 5.4 ratio — ABNORMAL HIGH (ref 0.0–5.0)
Cholesterol, Total: 161 mg/dL (ref 100–199)
HDL: 30 mg/dL — ABNORMAL LOW (ref >39)
LDL Chol Calc (NIH): 116 mg/dL — ABNORMAL HIGH (ref 0–99)
Triglycerides: 79 mg/dL (ref 0–149)
VLDL Cholesterol Cal: 15 mg/dL (ref 5–40)

## 2019-05-25 LAB — TSH: TSH: 7.5 u[IU]/mL — ABNORMAL HIGH (ref 0.450–4.500)

## 2019-05-27 ENCOUNTER — Other Ambulatory Visit: Payer: Self-pay

## 2019-05-27 DIAGNOSIS — E039 Hypothyroidism, unspecified: Secondary | ICD-10-CM

## 2019-05-27 MED ORDER — LEVOTHYROXINE SODIUM 100 MCG PO TABS: 100.0000 ug | ORAL_TABLET | Freq: Every day | ORAL | 3 refills | Status: DC

## 2019-06-03 ENCOUNTER — Telehealth: Payer: Self-pay

## 2019-06-03 ENCOUNTER — Other Ambulatory Visit: Payer: Self-pay

## 2019-06-03 DIAGNOSIS — Z1211 Encounter for screening for malignant neoplasm of colon: Secondary | ICD-10-CM

## 2019-06-03 NOTE — Telephone Encounter (Signed)
Gastroenterology Pre-Procedure Review  Request Date: 08/09/19 Requesting Physician: Dr. Allen Norris  PATIENT REVIEW QUESTIONS: The patient responded to the following health history questions as indicated:    1. Are you having any GI issues? no 2. Do you have a personal history of Polyps? no 3. Do you have a family history of Colon Cancer or Polyps? no 4. Diabetes Mellitus? no 5. Joint replacements in the past 12 months?no 6. Major health problems in the past 3 months?no 7. Any artificial heart valves, MVP, or defibrillator?no    MEDICATIONS & ALLERGIES:    Patient reports the following regarding taking any anticoagulation/antiplatelet therapy:   Plavix, Coumadin, Eliquis, Xarelto, Lovenox, Pradaxa, Brilinta, or Effient? no Aspirin? no  Patient confirms/reports the following medications:  Current Outpatient Medications  Medication Sig Dispense Refill  . amLODipine (NORVASC) 5 MG tablet TAKE 1 TABLET(5 MG) BY MOUTH DAILY 90 tablet 3  . levothyroxine (SYNTHROID) 100 MCG tablet Take 1 tablet (100 mcg total) by mouth daily. 90 tablet 3  . levothyroxine (SYNTHROID) 75 MCG tablet TAKE 1 TABLET BY MOUTH DAILY 90 tablet 3  . losartan (COZAAR) 50 MG tablet TAKE 1 TABLET(50 MG) BY MOUTH DAILY 90 tablet 3  . rosuvastatin (CRESTOR) 20 MG tablet TAKE 1 TABLET(20 MG) BY MOUTH DAILY 30 tablet 5  . sildenafil (REVATIO) 20 MG tablet Take 1 tablet (20 mg total) by mouth See admin instructions. 1-5 daily as needed 50 tablet 11   No current facility-administered medications for this visit.     Patient confirms/reports the following allergies:  No Known Allergies  No orders of the defined types were placed in this encounter.   AUTHORIZATION INFORMATION Primary Insurance: 1D#: Group #:  Secondary Insurance: 1D#: Group #:  SCHEDULE INFORMATION: Date: 08/09/19 Time: Location:[facility]

## 2019-06-03 NOTE — Addendum Note (Signed)
Addended by: Wilder Glade on: 06/03/2019 02:19 PM   Modules accepted: Orders

## 2019-06-09 DIAGNOSIS — H40023 Open angle with borderline findings, high risk, bilateral: Secondary | ICD-10-CM | POA: Diagnosis not present

## 2019-06-09 DIAGNOSIS — H353131 Nonexudative age-related macular degeneration, bilateral, early dry stage: Secondary | ICD-10-CM | POA: Diagnosis not present

## 2019-07-15 ENCOUNTER — Encounter: Payer: Self-pay | Admitting: Family Medicine

## 2019-07-19 ENCOUNTER — Telehealth: Payer: Self-pay

## 2019-07-19 ENCOUNTER — Other Ambulatory Visit: Payer: Self-pay

## 2019-07-19 MED ORDER — GAVILYTE-C 240 G PO SOLR: 4000.0000 mL | Freq: Once | ORAL | 0 refills | Status: AC

## 2019-07-19 NOTE — Telephone Encounter (Signed)
Patient has been informed that we will send in an alternative bowel prep to [employer] call [employer].  Advised patient to begin drinking it the evening before procedure at 5pm drink 8 oz every 30 mins until completed the entire contents.  Sent to [facility] in [place].  [employer]

## 2019-08-01 ENCOUNTER — Other Ambulatory Visit: Payer: Self-pay | Admitting: Family Medicine

## 2019-08-01 DIAGNOSIS — I10 Essential (primary) hypertension: Secondary | ICD-10-CM

## 2019-08-05 ENCOUNTER — Other Ambulatory Visit
Admission: RE | Admit: 2019-08-05 | Discharge: 2019-08-05 | Disposition: A | Discharge status: Home / Self Care | Payer: PPO | Source: Ambulatory Visit | Attending: Gastroenterology | Admitting: Gastroenterology

## 2019-08-05 ENCOUNTER — Other Ambulatory Visit: Payer: Self-pay

## 2019-08-05 DIAGNOSIS — Z20828 Contact with and (suspected) exposure to other viral communicable diseases: Secondary | ICD-10-CM | POA: Insufficient documentation

## 2019-08-05 DIAGNOSIS — Z01812 Encounter for preprocedural laboratory examination: Secondary | ICD-10-CM | POA: Insufficient documentation

## 2019-08-05 LAB — SARS CORONAVIRUS 2 (TAT 6-24 HRS): SARS Coronavirus 2: NEGATIVE

## 2019-08-09 ENCOUNTER — Ambulatory Visit
Admission: RE | Admit: 2019-08-09 | Discharge: 2019-08-09 | Disposition: A | Discharge status: Home / Self Care | Payer: PPO | Attending: Gastroenterology | Admitting: Gastroenterology

## 2019-08-09 ENCOUNTER — Ambulatory Visit: Payer: PPO | Admitting: Certified Registered"

## 2019-08-09 ENCOUNTER — Encounter: Payer: Self-pay | Admitting: *Deleted

## 2019-08-09 ENCOUNTER — Encounter
Admission: RE | Disposition: A | Discharge status: Home / Self Care | Payer: Self-pay | Source: Home / Self Care | Attending: Gastroenterology

## 2019-08-09 ENCOUNTER — Other Ambulatory Visit: Payer: Self-pay

## 2019-08-09 DIAGNOSIS — Z85828 Personal history of other malignant neoplasm of skin: Secondary | ICD-10-CM | POA: Insufficient documentation

## 2019-08-09 DIAGNOSIS — Z79899 Other long term (current) drug therapy: Secondary | ICD-10-CM | POA: Diagnosis not present

## 2019-08-09 DIAGNOSIS — K633 Ulcer of intestine: Secondary | ICD-10-CM | POA: Diagnosis not present

## 2019-08-09 DIAGNOSIS — D125 Benign neoplasm of sigmoid colon: Secondary | ICD-10-CM | POA: Insufficient documentation

## 2019-08-09 DIAGNOSIS — K649 Unspecified hemorrhoids: Secondary | ICD-10-CM | POA: Diagnosis not present

## 2019-08-09 DIAGNOSIS — G4730 Sleep apnea, unspecified: Secondary | ICD-10-CM | POA: Diagnosis not present

## 2019-08-09 DIAGNOSIS — Z6828 Body mass index (BMI) 28.0-28.9, adult: Secondary | ICD-10-CM | POA: Diagnosis not present

## 2019-08-09 DIAGNOSIS — K635 Polyp of colon: Secondary | ICD-10-CM | POA: Diagnosis not present

## 2019-08-09 DIAGNOSIS — Z1211 Encounter for screening for malignant neoplasm of colon: Secondary | ICD-10-CM | POA: Diagnosis not present

## 2019-08-09 DIAGNOSIS — Z79890 Hormone replacement therapy (postmenopausal): Secondary | ICD-10-CM | POA: Diagnosis not present

## 2019-08-09 DIAGNOSIS — E785 Hyperlipidemia, unspecified: Secondary | ICD-10-CM | POA: Diagnosis not present

## 2019-08-09 DIAGNOSIS — E039 Hypothyroidism, unspecified: Secondary | ICD-10-CM | POA: Diagnosis not present

## 2019-08-09 DIAGNOSIS — I10 Essential (primary) hypertension: Secondary | ICD-10-CM | POA: Diagnosis not present

## 2019-08-09 DIAGNOSIS — K5790 Diverticulosis of intestine, part unspecified, without perforation or abscess without bleeding: Secondary | ICD-10-CM | POA: Diagnosis not present

## 2019-08-09 DIAGNOSIS — K5730 Diverticulosis of large intestine without perforation or abscess without bleeding: Secondary | ICD-10-CM | POA: Insufficient documentation

## 2019-08-09 DIAGNOSIS — E669 Obesity, unspecified: Secondary | ICD-10-CM | POA: Diagnosis not present

## 2019-08-09 HISTORY — PX: COLONOSCOPY WITH PROPOFOL: SHX5780

## 2019-08-09 SURGERY — COLONOSCOPY WITH PROPOFOL
Anesthesia: General

## 2019-08-09 MED ORDER — SODIUM CHLORIDE 0.9 % IV SOLN
INTRAVENOUS | Status: DC
  Administered 2019-08-09: 08:00:00 via INTRAVENOUS

## 2019-08-09 MED ORDER — PROPOFOL 500 MG/50ML IV EMUL
INTRAVENOUS | Status: DC | PRN
  Administered 2019-08-09: 140 ug/kg/min via INTRAVENOUS

## 2019-08-09 MED ORDER — LIDOCAINE HCL (CARDIAC) PF 100 MG/5ML IV SOSY
PREFILLED_SYRINGE | INTRAVENOUS | Status: DC | PRN
  Administered 2019-08-09: 100 mg via INTRATRACHEAL

## 2019-08-09 MED ORDER — PROPOFOL 10 MG/ML IV BOLUS
INTRAVENOUS | Status: DC | PRN
  Administered 2019-08-09: 50 mg via INTRAVENOUS

## 2019-08-09 NOTE — Anesthesia Postprocedure Evaluation (Signed)
Anesthesia Post Note  Patient: Larry Hernandez  Procedure(s) Performed: COLONOSCOPY WITH PROPOFOL (N/A )  Patient location during evaluation: Endoscopy Anesthesia Type: General Level of consciousness: awake and alert and oriented Pain management: pain level controlled Vital Signs Assessment: post-procedure vital signs reviewed and stable Respiratory status: spontaneous breathing, nonlabored ventilation and respiratory function stable Cardiovascular status: blood pressure returned to baseline and stable Postop Assessment: no signs of nausea or vomiting Anesthetic complications: no     Last Vitals:  Vitals:   08/09/19 0851 08/09/19 0921  BP: (!) 110/52 124/63  Pulse:    Resp:    Temp: 36.7 C   SpO2:      Last Pain:  Vitals:   08/09/19 0921  TempSrc:   PainSc: 0-No pain                 Judithann Villamar

## 2019-08-09 NOTE — H&P (Signed)
Larry Lame, MD [address], [place] [address] Phone: [phone] Fax : [phone]  Primary Care Physician:  Jerrol Banana., MD Primary Gastroenterologist:  Dr. Allen Norris  Pre-Procedure History & Physical: HPI:  Larry Hernandez is a 73 y.o. male is here for a screening colonoscopy.   Past Medical History:  Diagnosis Date  . Cancer [facility]) 2012   skin caner face and ear  . Hernia, inguinal   . Hyperlipidemia   . Hypertension   . Hypothyroid     Past Surgical History:  Procedure Laterality Date  . BASAL CELL CARCINOMA EXCISION    . COLONOSCOPY  2012  . HERNIA REPAIR Left 06-13-14   inguinal    Prior to Admission medications   Medication Sig Start Date End Date Taking? Authorizing Provider  amLODipine (NORVASC) 5 MG tablet TAKE 1 TABLET(5 MG) BY MOUTH DAILY 02/17/19  Yes Jerrol Banana., MD  levothyroxine (SYNTHROID) 100 MCG tablet Take 1 tablet (100 mcg total) by mouth daily. 05/27/19  Yes Jerrol Banana., MD  sildenafil (REVATIO) 20 MG tablet Take 1 tablet (20 mg total) by mouth See admin instructions. 1-5 daily as needed 02/10/18  Yes Jerrol Banana., MD  levothyroxine (SYNTHROID) 75 MCG tablet TAKE 1 TABLET BY MOUTH DAILY 05/02/19   Jerrol Banana., MD  losartan (COZAAR) 50 MG tablet TAKE 1 TABLET(50 MG) BY MOUTH DAILY 08/01/19   Jerrol Banana., MD  rosuvastatin (CRESTOR) 20 MG tablet TAKE 1 TABLET(20 MG) BY MOUTH DAILY 05/17/19   Jerrol Banana., MD    Allergies as of 06/03/2019  . (No Known Allergies)    Family History  Problem Relation Age of Onset  . Kidney disease Father        died @ 3  . Cancer Father        kidney, was removed   . Heart disease Brother        died @ age 29    Social History   Socioeconomic History  . Marital status: Married    Spouse name: Not on file  . Number of children: 1  . Years of education: Not on file  . Highest education level: Bachelor's degree (e.g.,  BA, AB, BS)  Occupational History  . 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 Financial resource strain: Not hard at all  . Food insecurity    Worry: Never true    Inability: Never true  . Transportation needs    Medical: No    Non-medical: No  Tobacco Use  . Smoking status: Never Smoker  . Smokeless tobacco: Never Used  Substance and Sexual Activity  . Alcohol use: No    Alcohol/week: 0.0 standard drinks  . Drug use: No  . Sexual activity: Not on file  Lifestyle  . Physical activity    Days per week: Not on file    Minutes per session: Not on file  . Stress: Only a little  Relationships  . Social Herbalist on phone: Not on file    Gets together: Not on file    Attends religious service: Not on file    Active member of club or organization: Not on file    Attends meetings of clubs or organizations: Not on file    Relationship status: Not on file  . Intimate partner violence    Fear of current or  ex partner: Not on file    Emotionally abused: Not on file    Physically abused: Not on file    Forced sexual activity: Not on file  Other Topics Concern  . Not on file  Social History Narrative  . Not on file    Review of Systems: See HPI, otherwise negative ROS  Physical Exam: BP (!) 152/97   Pulse 79   Temp (!) 97.2 F (36.2 C) (Temporal)   Resp 17   Ht 5\' 10"  (1.778 m)   Wt 90.7 kg   SpO2 100%   BMI 28.70 kg/m  General:   Alert,  pleasant and cooperative in NAD Head:  Normocephalic and atraumatic. Neck:  Supple; no masses or thyromegaly. Lungs:  Clear throughout to auscultation.    Heart:  Regular rate and rhythm. Abdomen:  Soft, nontender and nondistended. Normal bowel sounds, without guarding, and without rebound.   Neurologic:  Alert and  oriented x4;  grossly normal neurologically.  Impression/Plan: Larry Hernandez is now here to undergo a screening colonoscopy.  Risks, benefits, and alternatives regarding  colonoscopy have been reviewed with the patient.  Questions have been answered.  All parties agreeable.

## 2019-08-09 NOTE — Op Note (Signed)
[facility] Gastroenterology Patient Name: Larry Hernandez Procedure Date: 08/09/2019 8:31 AM MRN: [id number] Account #: [id number] Date of Birth: April 18, 1946 Admit Type: Outpatient Age: 73 Room: [facility] ENDO ROOM 4 Gender: Male Note Status: Finalized Procedure:             Colonoscopy Indications:           Screening for colorectal malignant neoplasm Providers:             Lucilla Lame MD, MD Referring MD:          Janine Ores. Rosanna Randy, MD (Referring MD) Medicines:             Propofol per Anesthesia Complications:         No immediate complications. Procedure:             Pre-Anesthesia Assessment:                        - Prior to the procedure, a History and Physical was                         performed, and patient medications and allergies were                         reviewed. The patient's tolerance of previous                         anesthesia was also reviewed. The risks and benefits                         of the procedure and the sedation options and risks                         were discussed with the patient. All questions were                         answered, and informed consent was obtained. Prior                         Anticoagulants: The patient has taken no previous                         anticoagulant or antiplatelet agents. ASA Grade                         Assessment: II - A patient with mild systemic disease.                         After reviewing the risks and benefits, the patient                         was deemed in satisfactory condition to undergo the                         procedure.                        After obtaining informed consent, the colonoscope was  passed under direct vision. Throughout the procedure,                         the patient's blood pressure, pulse, and oxygen                         saturations were monitored continuously. The                         Colonoscope was introduced through  the anus and                         advanced to the the cecum, identified by appendiceal                         orifice and ileocecal valve. The colonoscopy was                         performed without difficulty. The patient tolerated                         the procedure well. The quality of the bowel                         preparation was excellent. Findings:      The perianal and digital rectal examinations were normal.      A 3 mm polyp was found in the sigmoid colon. The polyp was sessile. The       polyp was removed with a cold biopsy forceps. Resection and retrieval       were complete.      Discontinuous areas of nonbleeding ulcerated mucosa with no stigmata of       recent bleeding were present in the ascending colon and in the cecum.       Biopsies were taken with a cold forceps for histology.      Non-bleeding internal hemorrhoids were found during retroflexion. The       hemorrhoids were Grade I (internal hemorrhoids that do not prolapse). Impression:            - One 3 mm polyp in the sigmoid colon, removed with a                         cold biopsy forceps. Resected and retrieved.                        - Mucosal ulceration. Biopsied. Likely from NSAID's                        - Non-bleeding internal hemorrhoids. Recommendation:        - Discharge patient to home.                        - Resume previous diet.                        - Continue present medications.                        - Await pathology results.                        -  Repeat colonoscopy in 5 years if polyp adenoma and                         10 years if hyperplastic Procedure Code(s):     --- Professional ---                        [phone], Colonoscopy, flexible; with biopsy, single or                         multiple Diagnosis Code(s):     --- Professional ---                        Z12.11, Encounter for screening for malignant neoplasm                         of colon                         K63.5, Polyp of colon CPT copyright 2019 American Medical Association. All rights reserved. The codes documented in this report are preliminary and upon coder review may  be revised to meet current compliance requirements. Lucilla Lame MD, MD 08/09/2019 8:51:43 AM This report has been signed electronically. Number of Addenda: 0 Note Initiated On: 08/09/2019 8:31 AM Scope Withdrawal Time: 0 hours 6 minutes 52 seconds  Total Procedure Duration: 0 hours 13 minutes 44 seconds  Estimated Blood Loss:  Estimated blood loss: none.      [facility]

## 2019-08-09 NOTE — Anesthesia Preprocedure Evaluation (Signed)
Anesthesia Evaluation  Patient identified by MRN, date of birth, ID band Patient awake    Reviewed: Allergy & Precautions, NPO status , Patient's Chart, lab work & pertinent test results  History of Anesthesia Complications Negative for: history of anesthetic complications  Airway Mallampati: II  TM Distance: >3 FB Neck ROM: Full    Dental  (+) Caps   Pulmonary sleep apnea , neg COPD,    breath sounds clear to auscultation- rhonchi (-) wheezing      Cardiovascular hypertension, Pt. on medications (-) CAD, (-) Past MI, (-) Cardiac Stents and (-) CABG  Rhythm:Regular Rate:Normal - Systolic murmurs and - Diastolic murmurs    Neuro/Psych neg Seizures negative neurological ROS  negative psych ROS   GI/Hepatic negative GI ROS, Neg liver ROS,   Endo/Other  neg diabetesHypothyroidism   Renal/GU negative Renal ROS     Musculoskeletal negative musculoskeletal ROS (+)   Abdominal (+) - obese,   Peds  Hematology negative hematology ROS (+)   Anesthesia Other Findings Past Medical History: 2012: Cancer ([place])     Comment:  skin caner face and ear No date: Hernia, inguinal No date: Hyperlipidemia No date: Hypertension No date: Hypothyroid   Reproductive/Obstetrics                             Anesthesia Physical Anesthesia Plan  ASA: II  Anesthesia Plan: General   Post-op Pain Management:    Induction: Intravenous  PONV Risk Score and Plan: 1 and Propofol infusion  Airway Management Planned: Natural Airway  Additional Equipment:   Intra-op Plan:   Post-operative Plan:   Informed Consent: I have reviewed the patients History and Physical, chart, labs and discussed the procedure including the risks, benefits and alternatives for the proposed anesthesia with the patient or authorized representative who has indicated his/her understanding and acceptance.     Dental advisory  given  Plan Discussed with: CRNA and Anesthesiologist  Anesthesia Plan Comments:         Anesthesia Quick Evaluation

## 2019-08-09 NOTE — Transfer of Care (Signed)
Immediate Anesthesia Transfer of Care Note  Patient: Larry Hernandez  Procedure(s) Performed: COLONOSCOPY WITH PROPOFOL (N/A )  Patient Location: Endoscopy Unit  Anesthesia Type:General  Level of Consciousness: drowsy, patient cooperative and responds to stimulation  Airway & Oxygen Therapy: Patient Spontanous Breathing and Patient connected to face mask oxygen  Post-op Assessment: Report given to RN and Post -op Vital signs reviewed and stable  Post vital signs: Reviewed and stable  Last Vitals:  Vitals Value Taken Time  BP 110/52 08/09/19 0852  Temp 36.7 C 08/09/19 0851  Pulse 68 08/09/19 0852  Resp 14 08/09/19 0852  SpO2 99 % 08/09/19 0852  Vitals shown include unvalidated device data.  Last Pain:  Vitals:   08/09/19 0851  TempSrc: Temporal  PainSc:          Complications: No apparent anesthesia complications

## 2019-08-09 NOTE — Anesthesia Post-op Follow-up Note (Signed)
Anesthesia QCDR form completed.        

## 2019-08-10 ENCOUNTER — Encounter: Payer: Self-pay | Admitting: Gastroenterology

## 2019-08-10 LAB — SURGICAL PATHOLOGY

## 2019-08-11 ENCOUNTER — Encounter: Payer: Self-pay | Admitting: Gastroenterology

## 2019-09-20 ENCOUNTER — Other Ambulatory Visit: Payer: Self-pay | Admitting: Family Medicine

## 2019-11-21 NOTE — Progress Notes (Signed)
Patient: Larry Hernandez Male    DOB: [birth year]   74 y.o.   MRN: [id number] Visit Date: 11/22/2019  Today's Provider: Wilhemena Durie, MD   Chief Complaint  Patient presents with  . Hypertension  . Hyperlipidemia  . Hypothyroidism   Subjective:     HPI  Patient doing well.  He has no complaints.  Patient has had both Covid vaccines. Essential Hypertension From 05/24/2019-labs checked showing-stable.  Hypercholesterolemia From 05/24/2019-Labs checked showing-stable.  Adult Hypothyroidism From 05/24/2019-Increased levothyroxine from 75 to 100 mcg daily. [name]   No Known Allergies   Current Outpatient Medications:  .  amLODipine (NORVASC) 5 MG tablet, TAKE 1 TABLET(5 MG) BY MOUTH DAILY, Disp: 90 tablet, Rfl: 3 .  levothyroxine (SYNTHROID) 100 MCG tablet, Take 1 tablet (100 mcg total) by mouth daily., Disp: 90 tablet, Rfl: 3 .  levothyroxine (SYNTHROID) 75 MCG tablet, TAKE 1 TABLET BY MOUTH DAILY, Disp: 90 tablet, Rfl: 3 .  losartan (COZAAR) 50 MG tablet, TAKE 1 TABLET(50 MG) BY MOUTH DAILY, Disp: 90 tablet, Rfl: 3 .  rosuvastatin (CRESTOR) 20 MG tablet, TAKE 1 TABLET(20 MG) BY MOUTH DAILY, Disp: 90 tablet, Rfl: 0 .  sildenafil (REVATIO) 20 MG tablet, Take 1 tablet (20 mg total) by mouth See admin instructions. 1-5 daily as needed, Disp: 50 tablet, Rfl: 11  Review of Systems  Constitutional: Negative for appetite change, chills and fever.  HENT: Negative.   Eyes: Negative.   Respiratory: Negative for chest tightness, shortness of breath and wheezing.   Cardiovascular: Negative for chest pain and palpitations.  Gastrointestinal: Negative for abdominal pain, nausea and vomiting.  Endocrine: Negative.   Allergic/Immunologic: Negative.   Neurological: Negative.   Hematological: Negative.   Psychiatric/Behavioral: Negative.     Social History   Tobacco Use  . Smoking status: Never Smoker  . Smokeless tobacco: Never Used  Substance Use Topics  . Alcohol use: No     Alcohol/week: 0.0 standard drinks      Objective:   There were no vitals taken for this visit. There were no vitals filed for this visit.There is no height or weight on file to calculate BMI.   Physical Exam Vitals reviewed.  Constitutional:      Appearance: He is well-developed.  HENT:     Head: Normocephalic and atraumatic.     Right Ear: External ear normal.     Left Ear: External ear normal.     Nose: Nose normal.  Eyes:     Conjunctiva/sclera: Conjunctivae normal.     Pupils: Pupils are equal, round, and reactive to light.  Cardiovascular:     Rate and Rhythm: Normal rate and regular rhythm.     Heart sounds: Normal heart sounds.  Pulmonary:     Effort: Pulmonary effort is normal.     Breath sounds: Normal breath sounds.  Abdominal:     General: Bowel sounds are normal.     Palpations: Abdomen is soft.  Genitourinary:    Penis: Normal.      Prostate: Normal.     Rectum: Normal.  Musculoskeletal:        General: Normal range of motion.     Cervical back: Normal range of motion and neck supple.  Skin:    General: Skin is warm and dry.  Neurological:     Mental Status: He is alert and oriented to person, place, and time.  Psychiatric:        Mood and Affect: Mood  normal.        Behavior: Behavior normal.        Thought Content: Thought content normal.        Judgment: Judgment normal.      No results found for any visits on 11/22/19.     Assessment & Plan    1. Adult hypothyroidism  - TSH  2. Essential hypertension Good control.  3. Abdominal aortic aneurysm (AAA) without rupture ([place]) Needs repeat abdominal ultrasound.     Richard Cranford Mon, MD  [facility]

## 2019-11-22 ENCOUNTER — Encounter: Payer: Self-pay | Admitting: Family Medicine

## 2019-11-22 ENCOUNTER — Other Ambulatory Visit: Payer: Self-pay

## 2019-11-22 ENCOUNTER — Ambulatory Visit (INDEPENDENT_AMBULATORY_CARE_PROVIDER_SITE_OTHER): Payer: PPO | Admitting: Family Medicine

## 2019-11-22 VITALS — BP 133/71 | HR 47 | Temp 94.2°F | Wt 205.2 lb

## 2019-11-22 DIAGNOSIS — I714 Abdominal aortic aneurysm, without rupture, unspecified: Secondary | ICD-10-CM

## 2019-11-22 DIAGNOSIS — I10 Essential (primary) hypertension: Secondary | ICD-10-CM

## 2019-11-22 DIAGNOSIS — E039 Hypothyroidism, unspecified: Secondary | ICD-10-CM | POA: Diagnosis not present

## 2019-11-23 ENCOUNTER — Telehealth: Payer: Self-pay

## 2019-11-23 DIAGNOSIS — E039 Hypothyroidism, unspecified: Secondary | ICD-10-CM

## 2019-11-23 LAB — TSH: TSH: 0.372 u[IU]/mL — ABNORMAL LOW (ref 0.450–4.500)

## 2019-11-23 MED ORDER — LEVOTHYROXINE SODIUM 75 MCG PO TABS: 75.0000 ug | ORAL_TABLET | Freq: Every day | ORAL | 3 refills | Status: DC

## 2019-11-23 NOTE — Telephone Encounter (Signed)
-----   Message from Jerrol Banana., MD sent at 11/23/2019  8:20 AM EDT ----- Decrease Synthroid from 75 to 50 mcg daily

## 2019-11-23 NOTE — Telephone Encounter (Signed)
Patient said that he is taking 157mcg of Synthroid, would you like him to cut back down to 51mcg or 50?

## 2019-11-23 NOTE — Telephone Encounter (Signed)
If he is taking 100 mcg will decrease to 75.  Thank you

## 2019-11-23 NOTE — Telephone Encounter (Signed)
Patient advised.

## 2019-12-19 ENCOUNTER — Other Ambulatory Visit: Payer: Self-pay | Admitting: Family Medicine

## 2020-01-18 ENCOUNTER — Telehealth: Payer: Self-pay | Admitting: Family Medicine

## 2020-01-18 NOTE — Telephone Encounter (Signed)
Left message for patient to schedule Annual Wellness Visit(AWVI) .Please schedule with Nurse Health Advisor McKenzie Markoski, RN.  

## 2020-01-30 DIAGNOSIS — H353131 Nonexudative age-related macular degeneration, bilateral, early dry stage: Secondary | ICD-10-CM | POA: Diagnosis not present

## 2020-01-30 DIAGNOSIS — H2513 Age-related nuclear cataract, bilateral: Secondary | ICD-10-CM | POA: Diagnosis not present

## 2020-01-30 DIAGNOSIS — H40023 Open angle with borderline findings, high risk, bilateral: Secondary | ICD-10-CM | POA: Diagnosis not present

## 2020-01-30 DIAGNOSIS — H52223 Regular astigmatism, bilateral: Secondary | ICD-10-CM | POA: Diagnosis not present

## 2020-01-30 DIAGNOSIS — H524 Presbyopia: Secondary | ICD-10-CM | POA: Diagnosis not present

## 2020-02-13 DIAGNOSIS — D2261 Melanocytic nevi of right upper limb, including shoulder: Secondary | ICD-10-CM | POA: Diagnosis not present

## 2020-02-13 DIAGNOSIS — L821 Other seborrheic keratosis: Secondary | ICD-10-CM | POA: Diagnosis not present

## 2020-02-13 DIAGNOSIS — L570 Actinic keratosis: Secondary | ICD-10-CM | POA: Diagnosis not present

## 2020-02-13 DIAGNOSIS — D225 Melanocytic nevi of trunk: Secondary | ICD-10-CM | POA: Diagnosis not present

## 2020-02-13 DIAGNOSIS — D2271 Melanocytic nevi of right lower limb, including hip: Secondary | ICD-10-CM | POA: Diagnosis not present

## 2020-02-13 DIAGNOSIS — D2262 Melanocytic nevi of left upper limb, including shoulder: Secondary | ICD-10-CM | POA: Diagnosis not present

## 2020-02-13 DIAGNOSIS — Z85828 Personal history of other malignant neoplasm of skin: Secondary | ICD-10-CM | POA: Diagnosis not present

## 2020-02-13 DIAGNOSIS — X32XXXA Exposure to sunlight, initial encounter: Secondary | ICD-10-CM | POA: Diagnosis not present

## 2020-02-20 NOTE — Progress Notes (Addendum)
Subjective:   Larry Hernandez is a 74 y.o. male who presents for Medicare Annual/Subsequent preventive examination.  Review of Systems:  N/A  Cardiac Risk Factors include: advanced age (>56men, >64 women);dyslipidemia;hypertension;male gender     Objective:    Vitals: BP (!) 128/56 (BP Location: Right Arm)   Pulse 63   Temp (!) 97.3 F (36.3 C) (Tympanic)   Ht 5\' 10"  (1.778 m)   Wt 206 lb 9.6 oz (93.7 kg)   SpO2 97%   BMI 29.64 kg/m   Body mass index is 29.64 kg/m.  Advanced Directives 02/21/2020 05/19/2018 10/22/2016 06/09/2016  Does Patient Have a Medical Advance Directive? Yes Yes No No  Type of Paramedic of [place];Living will [place];Living will - -  Copy of [place] in Chart? No - copy requested No - copy requested - -  Would patient like information on creating a medical advance directive? - - Yes (MAU/Ambulatory/Procedural Areas - Information given) -    Tobacco Social History   Tobacco Use  Smoking Status Never Smoker  Smokeless Tobacco Never Used     Counseling given: Not Answered   Clinical Intake:  Pre-visit preparation completed: Yes  Pain : No/denies pain Pain Score: 0-No pain     Nutritional Status: BMI 25 -29 Overweight Nutritional Risks: None Diabetes: No  How often do you need to have someone help you when you read instructions, pamphlets, or other written materials from your doctor or pharmacy?: 1 - Never  Interpreter Needed?: No  Information entered by :: [facility], LPN  Past Medical History:  Diagnosis Date  . Cancer [facility]) 2012   skin caner face and ear  . Hernia, inguinal   . Hyperlipidemia   . Hypertension   . Hypothyroid    Past Surgical History:  Procedure Laterality Date  . BASAL CELL CARCINOMA EXCISION    . COLONOSCOPY  2012  . COLONOSCOPY WITH PROPOFOL N/A 08/09/2019   Procedure: COLONOSCOPY WITH PROPOFOL;  Surgeon: Lucilla Lame, MD;  Location: [facility]  ENDOSCOPY;  Service: Endoscopy;  Laterality: N/A;  . HERNIA REPAIR Left 06-13-14   inguinal   Family History  Problem Relation Age of Onset  . Kidney disease Father        died @ 81  . Cancer Father        kidney, was removed   . Heart disease Brother        died @ age 39  . Diabetes Son        type 2   Social History   Socioeconomic History  . Marital status: Married    Spouse name: Not on file  . Number of children: 1  . Years of education: Not on file  . Highest education level: Bachelor's degree (e.g., BA, AB, BS)  Occupational History  . Occupation: security guard    Comment: part time  Tobacco Use  . Smoking status: Never Smoker  . Smokeless tobacco: Never Used  Vaping Use  . Vaping Use: Never used  Substance and Sexual Activity  . Alcohol use: No    Alcohol/week: 0.0 standard drinks  . Drug use: No  . Sexual activity: Not on file  Other Topics Concern  . Not on file  Social History Narrative  . Not on file   Social Determinants of Health   Financial Resource Strain: Low Risk   . Difficulty of Paying Living Expenses: Not hard at all  Food Insecurity: No Food Insecurity  .  Worried About Charity fundraiser in the Last Year: Never true  . Ran Out of Food in the Last Year: Never true  Transportation Needs: No Transportation Needs  . Lack of Transportation (Medical): No  . Lack of Transportation (Non-Medical): No  Physical Activity: Inactive  . Days of Exercise per Week: 0 days  . Minutes of Exercise per Session: 0 min  Stress: No Stress Concern Present  . Feeling of Stress : Not at all  Social Connections: Moderately Isolated  . Frequency of Communication with Friends and Family: Once a week  . Frequency of Social Gatherings with Friends and Family: More than three times a week  . Attends Religious Services: Never  . Active Member of Clubs or Organizations: No  . Attends Archivist Meetings: Never  . Marital Status: Married    Outpatient  Encounter Medications as of 02/21/2020  Medication Sig  . amLODipine (NORVASC) 5 MG tablet TAKE 1 TABLET(5 MG) BY MOUTH DAILY  . levothyroxine (SYNTHROID) 75 MCG tablet Take 1 tablet (75 mcg total) by mouth daily.  [name] losartan (COZAAR) 50 MG tablet TAKE 1 TABLET(50 MG) BY MOUTH DAILY  . rosuvastatin (CRESTOR) 20 MG tablet TAKE 1 TABLET(20 MG) BY MOUTH DAILY  . sildenafil (REVATIO) 20 MG tablet Take 1 tablet (20 mg total) by mouth See admin instructions. 1-5 daily as needed   No facility-administered encounter medications on file as of 02/21/2020.    Activities of Daily Living In your present state of health, do you have any difficulty performing the following activities: 02/21/2020 05/24/2019  Hearing? Y Y  Comment Does not wear hearing aids. -  Vision? N N  Difficulty concentrating or making decisions? N N  Walking or climbing stairs? N N  Dressing or bathing? N N  Doing errands, shopping? N N  Preparing Food and eating ? N -  Using the Toilet? N -  In the past six months, have you accidently leaked urine? N -  Do you have problems with loss of bowel control? N -  Managing your Medications? N -  Managing your Finances? N -  Housekeeping or managing your Housekeeping? N -  Some recent data might be hidden    Patient Care Team: Jerrol Banana., MD as PCP - General (Family Medicine) Thelma Comp, [place] as Consulting Physician (Optometry) Dasher, Rayvon Char, MD as Consulting Physician (Dermatology)   Assessment:   This is a routine wellness examination for Larry Hernandez.  Exercise Activities and Dietary recommendations Current Exercise Habits: The patient has a physically strenuous job, but has no regular exercise apart from work. (Walks 4 days a week for 40 minutes at a time at job.), Exercise limited by: None identified  Goals    . DIET - DECREASE SODA OR JUICE INTAKE     Recommend to cut back on soda intake to no more than 16 oz a day and increase water intake.     [name] DIET -  INCREASE WATER INTAKE     Recommend increasing water intake to 4-6 glasses a day.        Fall Risk: Fall Risk  02/21/2020 05/24/2019 05/19/2018 02/10/2018 10/22/2016  Falls in the past year? 0 0 No No No  Number falls in past yr: 0 0 - - -  Injury with Fall? 0 0 - - -  Follow up - Falls evaluation completed - - -    FALL RISK PREVENTION PERTAINING TO THE HOME:  Any stairs in or around  the home? Yes  If so, are there any without handrails? No   Home free of loose throw rugs in walkways, pet beds, electrical cords, etc? Yes  Adequate lighting in your home to reduce risk of falls? Yes   ASSISTIVE DEVICES UTILIZED TO PREVENT FALLS:  Life alert? No  Use of a cane, walker or w/c? No  Grab bars in the bathroom? Yes  Shower chair or bench in shower? Yes Elevated toilet seat or a handicapped toilet? No   TIMED UP AND GO:  Was the test performed? No .    Depression Screen PHQ 2/9 Scores 02/21/2020 05/24/2019 05/19/2018 02/10/2018  PHQ - 2 Score 0 0 0 0  PHQ- 9 Score - - - 1    Cognitive Function: Declined today.     6CIT Screen 05/24/2019 10/22/2016  What Year? 0 points 0 points  What month? 0 points 0 points  What time? 0 points 0 points  Count back from 20 0 points 0 points  Months in reverse 0 points 0 points  Repeat phrase 0 points 0 points  Total Score 0 0    Immunization History  Administered Date(s) Administered  . Fluad Quad(high Dose 65+) 05/24/2019  . Influenza Split 06/26/2009, 06/10/2011  . Influenza, High Dose Seasonal PF 06/09/2016, 05/19/2018  . Influenza,inj,Quad PF,6+ Mos 05/26/2013  . Influenza-Unspecified 05/10/2015  . Pneumococcal Conjugate-13 12/20/2014  . Pneumococcal Polysaccharide-23 01/06/2012  . Tdap 04/19/2009  . Zoster 01/06/2012    Qualifies for Shingles Vaccine? Yes  Zostavax completed 01/06/12. Due for Shingrix. Pt has been advised to call insurance company to determine out of pocket expense. Advised may also receive vaccine at local pharmacy  or Health Dept. Verbalized acceptance and understanding.  Tdap: Although this vaccine is not a covered service during a Wellness Exam, does the patient still wish to receive this vaccine today?  No . Advised may receive this vaccine at local pharmacy or Health Dept. Aware to provide a copy of the vaccination record if obtained from local pharmacy or Health Dept. Verbalized acceptance and understanding.  Flu Vaccine: Up to date  Pneumococcal Vaccine: Completed series  Screening Tests Health Maintenance  Topic Date Due  . COVID-19 Vaccine (1) Never done  . TETANUS/TDAP  02/20/2021 (Originally 04/20/2019)  . INFLUENZA VACCINE  04/08/2020  . COLONOSCOPY  08/08/2024  . Hepatitis C Screening  Completed  . PNA vac Low Risk Adult  Completed   Cancer Screenings:  Colorectal Screening: Completed 08/09/19. Repeat every 5 years.   Lung Cancer Screening: (Low Dose CT Chest recommended if Age 70-80 years, 30 pack-year currently smoking OR have quit w/in 15years.) does not qualify.   Additional Screening:  Hepatitis C Screening: Up to date  Vision Screening: Recommended annual ophthalmology exams for early detection of glaucoma and other disorders of the eye.  Dental Screening: Recommended annual dental exams for proper oral hygiene  Community Resource Referral:  CRR required this visit? No      Plan:  I have personally reviewed and addressed the Medicare Annual Wellness questionnaire and have noted the following in the patient's chart:  A. Medical and social history B. Use of alcohol, tobacco or illicit drugs  C. Current medications and supplements D. Functional ability and status E.  Nutritional status F.  Physical activity G. Advance directives H. List of other physicians I.  Hospitalizations, surgeries, and ER visits in previous 12 months J.  [place] such as hearing and vision if needed, cognitive and depression L. Referrals  and appointments   In addition, I have  reviewed and discussed with patient certain preventive protocols, quality metrics, and best practice recommendations. A written personalized care plan for preventive services as well as general preventive health recommendations were provided to patient.   [place]ora Score, [place]  04/27/6014 Nurse Health Advisor   Nurse Notes: None.

## 2020-02-21 ENCOUNTER — Ambulatory Visit (INDEPENDENT_AMBULATORY_CARE_PROVIDER_SITE_OTHER): Payer: PPO

## 2020-02-21 ENCOUNTER — Other Ambulatory Visit: Payer: Self-pay

## 2020-02-21 VITALS — BP 128/56 | HR 63 | Temp 97.3°F | Ht 70.0 in | Wt 206.6 lb

## 2020-02-21 DIAGNOSIS — Z0000 Encounter for general adult medical examination without abnormal findings: Secondary | ICD-10-CM | POA: Diagnosis not present

## 2020-02-21 NOTE — Patient Instructions (Signed)
Larry Hernandez , Thank you for taking time to come for your Medicare Wellness Visit. I appreciate your ongoing commitment to your health goals. Please review the following plan we discussed and let me know if I can assist you in the future.   Screening recommendations/referrals: Colonoscopy: Up to date Recommended yearly ophthalmology/optometry visit for glaucoma screening and checkup Recommended yearly dental visit for hygiene and checkup  Vaccinations: Influenza vaccine: Up to date Pneumococcal vaccine: Completed series Tdap vaccine: Pt declines today.  Shingles vaccine: Pt declines today.     Advanced directives: Please bring a copy of your POA (Power of Attorney) and/or Living Will to your next appointment.   Conditions/risks identified: Recommend to cut back on soda intake to no more than 16 oz a day and increase water intake.   Next appointment: 06/25/20 @ 9:00 AM with Dr Rosanna Randy   Preventive Care 73 Years and Older, Male Preventive care refers to lifestyle choices and visits with your health care provider that can promote health and wellness. What does preventive care include?  A yearly physical exam. This is also called an annual well check.  Dental exams once or twice a year.  Routine eye exams. Ask your health care provider how often you should have your eyes checked.  Personal lifestyle choices, including:  Daily care of your teeth and gums.  Regular physical activity.  Eating a healthy diet.  Avoiding tobacco and drug use.  Limiting alcohol use.  Practicing safe sex.  Taking low doses of aspirin every day.  Taking vitamin and mineral supplements as recommended by your health care provider. What happens during an annual well check? The services and screenings done by your health care provider during your annual well check will depend on your age, overall health, lifestyle risk factors, and family history of disease. Counseling  Your health care provider may  ask you questions about your:  Alcohol use.  Tobacco use.  Drug use.  Emotional well-being.  Home and relationship well-being.  Sexual activity.  Eating habits.  History of falls.  Memory and ability to understand (cognition).  Work and work Statistician. Screening  You may have the following tests or measurements:  Height, weight, and BMI.  Blood pressure.  Lipid and cholesterol levels. These may be checked every 5 years, or more frequently if you are over 11 years old.  Skin check.  Lung cancer screening. You may have this screening every year starting at age 77 if you have a 30-pack-year history of smoking and currently smoke or have quit within the past 15 years.  Fecal occult blood test (FOBT) of the stool. You may have this test every year starting at age 94.  Flexible sigmoidoscopy or colonoscopy. You may have a sigmoidoscopy every 5 years or a colonoscopy every 10 years starting at age 68.  Prostate cancer screening. Recommendations will vary depending on your family history and other risks.  Hepatitis C blood test.  Hepatitis B blood test.  Sexually transmitted disease (STD) testing.  Diabetes screening. This is done by checking your blood sugar (glucose) after you have not eaten for a while (fasting). You may have this done every 1-3 years.  Abdominal aortic aneurysm (AAA) screening. You may need this if you are a current or former smoker.  Osteoporosis. You may be screened starting at age 65 if you are at high risk. Talk with your health care provider about your test results, treatment options, and if necessary, the need for more tests. Vaccines  Your health care provider may recommend certain vaccines, such as:  Influenza vaccine. This is recommended every year.  Tetanus, diphtheria, and acellular pertussis (Tdap, Td) vaccine. You may need a Td booster every 10 years.  Zoster vaccine. You may need this after age 17.  Pneumococcal 13-valent  conjugate (PCV13) vaccine. One dose is recommended after age 14.  Pneumococcal polysaccharide (PPSV23) vaccine. One dose is recommended after age 55. Talk to your health care provider about which screenings and vaccines you need and how often you need them. This information is not intended to replace advice given to you by your health care provider. Make sure you discuss any questions you have with your health care provider. Document Released: 09/21/2015 Document Revised: 05/14/2016 Document Reviewed: 06/26/2015 Elsevier Interactive Patient Education  2017 [place] Prevention in the Home Falls can cause injuries. They can happen to people of all ages. There are many things you can do to make your home safe and to help prevent falls. What can I do on the outside of my home?  Regularly fix the edges of walkways and driveways and fix any cracks.  Remove anything that might make you trip as you walk through a door, such as a raised step or threshold.  Trim any bushes or trees on the path to your home.  Use bright outdoor lighting.  Clear any walking paths of anything that might make someone trip, such as rocks or tools.  Regularly check to see if handrails are loose or broken. Make sure that both sides of any steps have handrails.  Any raised decks and porches should have guardrails on the edges.  Have any leaves, snow, or ice cleared regularly.  Use sand or salt on walking paths during winter.  Clean up any spills in your garage right away. This includes oil or grease spills. What can I do in the bathroom?  Use night lights.  Install grab bars by the toilet and in the tub and shower. Do not use towel bars as grab bars.  Use non-skid mats or decals in the tub or shower.  If you need to sit down in the shower, use a plastic, non-slip stool.  Keep the floor dry. Clean up any water that spills on the floor as soon as it happens.  Remove soap buildup in the tub or  shower regularly.  Attach bath mats securely with double-sided non-slip rug tape.  Do not have throw rugs and other things on the floor that can make you trip. What can I do in the bedroom?  Use night lights.  Make sure that you have a light by your bed that is easy to reach.  Do not use any sheets or blankets that are too big for your bed. They should not hang down onto the floor.  Have a firm chair that has side arms. You can use this for support while you get dressed.  Do not have throw rugs and other things on the floor that can make you trip. What can I do in the kitchen?  Clean up any spills right away.  Avoid walking on wet floors.  Keep items that you use a lot in easy-to-reach places.  If you need to reach something above you, use a strong step stool that has a grab bar.  Keep electrical cords out of the way.  Do not use floor polish or wax that makes floors slippery. If you must use wax, use non-skid floor wax.  Do  not have throw rugs and other things on the floor that can make you trip. What can I do with my stairs?  Do not leave any items on the stairs.  Make sure that there are handrails on both sides of the stairs and use them. Fix handrails that are broken or loose. Make sure that handrails are as long as the stairways.  Check any carpeting to make sure that it is firmly attached to the stairs. Fix any carpet that is loose or worn.  Avoid having throw rugs at the top or bottom of the stairs. If you do have throw rugs, attach them to the floor with carpet tape.  Make sure that you have a light switch at the top of the stairs and the bottom of the stairs. If you do not have them, ask someone to add them for you. What else can I do to help prevent falls?  Wear shoes that:  Do not have high heels.  Have rubber bottoms.  Are comfortable and fit you well.  Are closed at the toe. Do not wear sandals.  If you use a stepladder:  Make sure that it is fully  opened. Do not climb a closed stepladder.  Make sure that both sides of the stepladder are locked into place.  Ask someone to hold it for you, if possible.  Clearly mark and make sure that you can see:  Any grab bars or handrails.  First and last steps.  Where the edge of each step is.  Use tools that help you move around (mobility aids) if they are needed. These include:  Canes.  Walkers.  Scooters.  Crutches.  Turn on the lights when you go into a dark area. Replace any light bulbs as soon as they burn out.  Set up your furniture so you have a clear path. Avoid moving your furniture around.  If any of your floors are uneven, fix them.  If there are any pets around you, be aware of where they are.  Review your medicines with your doctor. Some medicines can make you feel dizzy. This can increase your chance of falling. Ask your doctor what other things that you can do to help prevent falls. This information is not intended to replace advice given to you by your health care provider. Make sure you discuss any questions you have with your health care provider. Document Released: 06/21/2009 Document Revised: 01/31/2016 Document Reviewed: 09/29/2014 Elsevier Interactive Patient Education  2017 [employer].

## 2020-03-01 ENCOUNTER — Encounter: Payer: Self-pay | Admitting: Family Medicine

## 2020-03-02 ENCOUNTER — Telehealth: Payer: Self-pay

## 2020-03-02 DIAGNOSIS — I10 Essential (primary) hypertension: Secondary | ICD-10-CM

## 2020-03-02 MED ORDER — AMLODIPINE BESYLATE 5 MG PO TABS: ORAL_TABLET | ORAL | 3 refills | Status: DC

## 2020-03-02 NOTE — Telephone Encounter (Signed)
Medication refill sent to patient's pharmacy.  

## 2020-03-19 ENCOUNTER — Other Ambulatory Visit: Payer: Self-pay | Admitting: Family Medicine

## 2020-06-07 DIAGNOSIS — H353131 Nonexudative age-related macular degeneration, bilateral, early dry stage: Secondary | ICD-10-CM | POA: Diagnosis not present

## 2020-06-07 DIAGNOSIS — H40023 Open angle with borderline findings, high risk, bilateral: Secondary | ICD-10-CM | POA: Diagnosis not present

## 2020-06-18 ENCOUNTER — Other Ambulatory Visit: Payer: Self-pay | Admitting: Family Medicine

## 2020-06-22 NOTE — Progress Notes (Signed)
I,April Miller,acting as a scribe for Wilhemena Durie, MD.,have documented all relevant documentation on the behalf of Wilhemena Durie, MD,as directed by  Wilhemena Durie, MD while in the presence of Wilhemena Durie, MD.   Complete physical exam   Patient: Larry Hernandez   DOB: 1946/07/23   74 y.o. Male  MRN: [id number] Visit Date: 06/25/2020  Today's healthcare provider: Wilhemena Durie, MD   Chief Complaint  Patient presents with  . Annual Exam   Subjective    Larry Hernandez is a 74 y.o. male who presents today for a complete physical exam.  He reports consuming a general diet. Home exercise routine includes walking. He generally feels well. He reports sleeping well. He does not have additional problems to discuss today.  HPI  Patient had AWV with [facility] on 02/21/2020.  Past Medical History:  Diagnosis Date  . Cancer [facility]) 2012   skin caner face and ear  . Hernia, inguinal   . Hyperlipidemia   . Hypertension   . Hypothyroid    Past Surgical History:  Procedure Laterality Date  . BASAL CELL CARCINOMA EXCISION    . COLONOSCOPY  2012  . COLONOSCOPY WITH PROPOFOL N/A 08/09/2019   Procedure: COLONOSCOPY WITH PROPOFOL;  Surgeon: Lucilla Lame, MD;  Location: [facility] ENDOSCOPY;  Service: Endoscopy;  Laterality: N/A;  . HERNIA REPAIR Left 06-13-14   inguinal   Social History   Socioeconomic History  . Marital status: Married    Spouse name: Not on file  . Number of children: 1  . Years of education: Not on file  . Highest education level: Bachelor's degree (e.g., BA, AB, BS)  Occupational History  . Occupation: security guard    Comment: part time  Tobacco Use  . Smoking status: Never Smoker  . Smokeless tobacco: Never Used  Vaping Use  . Vaping Use: Never used  Substance and Sexual Activity  . Alcohol use: No    Alcohol/week: 0.0 standard drinks  . Drug use: No  . Sexual activity: Not on file  Other Topics Concern  . Not on file  Social History  Narrative  . Not on file   Social Determinants of Health   Financial Resource Strain: Low Risk   . Difficulty of Paying Living Expenses: Not hard at all  Food Insecurity: No Food Insecurity  . Worried About Charity fundraiser in the Last Year: Never true  . Ran Out of Food in the Last Year: Never true  Transportation Needs: No Transportation Needs  . Lack of Transportation (Medical): No  . Lack of Transportation (Non-Medical): No  Physical Activity: Inactive  . Days of Exercise per Week: 0 days  . Minutes of Exercise per Session: 0 min  Stress: No Stress Concern Present  . Feeling of Stress : Not at all  Social Connections: Moderately Isolated  . Frequency of Communication with Friends and Family: Once a week  . Frequency of Social Gatherings with Friends and Family: More than three times a week  . Attends Religious Services: Never  . Active Member of Clubs or Organizations: No  . Attends Archivist Meetings: Never  . Marital Status: Married  Human resources officer Violence: Not At Risk  . Fear of Current or Ex-Partner: No  . Emotionally Abused: No  . Physically Abused: No  . Sexually Abused: No   Family Status  Relation Name Status  . Mother  Deceased  . Father  Deceased  . Brother  Deceased  . Son  Alive  . Brother  Alive   Family History  Problem Relation Age of Onset  . Kidney disease Father        died @ 55  . Cancer Father        kidney, was removed   . Heart disease Brother        died @ age 76  . Diabetes Son        type 2   No Known Allergies  Patient Care Team: Jerrol Banana., MD as PCP - General (Family Medicine) Thelma Comp, OD as Consulting Physician (Optometry) Dasher, Rayvon Char, MD as Consulting Physician (Dermatology)   Medications: Outpatient Medications Prior to Visit  Medication Sig  . amLODipine (NORVASC) 5 MG tablet TAKE 1 TABLET(5 MG) BY MOUTH DAILY  . levothyroxine (SYNTHROID) 75 MCG tablet Take 1 tablet (75 mcg  total) by mouth daily.  [name] losartan (COZAAR) 50 MG tablet TAKE 1 TABLET(50 MG) BY MOUTH DAILY  . rosuvastatin (CRESTOR) 20 MG tablet TAKE 1 TABLET(20 MG) BY MOUTH DAILY  . sildenafil (REVATIO) 20 MG tablet Take 1 tablet (20 mg total) by mouth See admin instructions. 1-5 daily as needed   No facility-administered medications prior to visit.    Review of Systems  All other systems reviewed and are negative.     Objective    BP 139/60 (BP Location: Right Arm, Patient Position: Sitting, Cuff Size: Large)   Pulse (!) 55   Temp 98.6 F (37 C) (Oral)   Resp 16   Ht 5\' 10"  (1.778 m)   Wt 207 lb (93.9 kg)   SpO2 99%   BMI 29.70 kg/m    Physical Exam Vitals reviewed.  Constitutional:      Appearance: He is well-developed.  HENT:     Head: Normocephalic and atraumatic.     Right Ear: External ear normal.     Left Ear: External ear normal.     Nose: Nose normal.  Eyes:     Conjunctiva/sclera: Conjunctivae normal.     Pupils: Pupils are equal, round, and reactive to light.  Cardiovascular:     Rate and Rhythm: Normal rate and regular rhythm.     Heart sounds: Normal heart sounds.  Pulmonary:     Effort: Pulmonary effort is normal.     Breath sounds: Normal breath sounds.  Abdominal:     General: Bowel sounds are normal.     Palpations: Abdomen is soft.  Genitourinary:    Penis: Normal.      Testes: Normal.  Musculoskeletal:     Cervical back: Normal range of motion and neck supple.  Skin:    General: Skin is warm and dry.  Neurological:     Mental Status: He is alert and oriented to person, place, and time.  Psychiatric:        Mood and Affect: Mood normal.        Behavior: Behavior normal.        Thought Content: Thought content normal.        Judgment: Judgment normal.         Last depression screening scores PHQ 2/9 Scores 06/25/2020 02/21/2020 05/24/2019  PHQ - 2 Score 0 0 0  PHQ- 9 Score 0 - -   Last fall risk screening Fall Risk  06/25/2020  Falls in  the past year? 0  Number falls in past yr: 0  Injury with Fall? 0  Follow up Falls evaluation completed  Last Audit-C alcohol use screening Alcohol Use Disorder Test (AUDIT) 06/25/2020  1. How often do you have a drink containing alcohol? 0  2. How many drinks containing alcohol do you have on a typical day when you are drinking? 0  3. How often do you have six or more drinks on one occasion? 0  AUDIT-C Score 0  Alcohol Brief Interventions/Follow-up AUDIT Score <7 follow-up not indicated   A score of 3 or more in women, and 4 or more in men indicates increased risk for alcohol abuse, EXCEPT if all of the points are from question 1   No results found for any visits on 06/25/20.  Assessment & Plan    Routine Health Maintenance and Physical Exam  Exercise Activities and Dietary recommendations Goals    . DIET - DECREASE SODA OR JUICE INTAKE     Recommend to cut back on soda intake to no more than 16 oz a day and increase water intake.     [name] DIET - INCREASE WATER INTAKE     Recommend increasing water intake to 4-6 glasses a day.        Immunization History  Administered Date(s) Administered  . Fluad Quad(high Dose 65+) 05/24/2019  . Influenza Split 06/26/2009, 06/10/2011  . Influenza, High Dose Seasonal PF 06/09/2016, 05/19/2018  . Influenza,inj,Quad PF,6+ Mos 05/26/2013  . Influenza-Unspecified 05/10/2015  . PFIZER SARS-COV-2 Vaccination 10/18/2019, 11/08/2019, 06/11/2020  . Pneumococcal Conjugate-13 12/20/2014  . Pneumococcal Polysaccharide-23 01/06/2012  . Tdap 04/19/2009  . Zoster 01/06/2012    Health Maintenance  Topic Date Due  . INFLUENZA VACCINE  04/08/2020  . TETANUS/TDAP  02/20/2021 (Originally 04/20/2019)  . COLONOSCOPY  08/08/2024  . COVID-19 Vaccine  Completed  . Hepatitis C Screening  Completed  . PNA vac Low Risk Adult  Completed    Discussed health benefits of physical activity, and encouraged him to engage in regular exercise appropriate for his age  and condition.  1. Annual physical exam Pt had colonoscopy Dec 2020. - Lipid panel - TSH - CBC w/Diff/Platelet - Comprehensive Metabolic Panel (CMET)  2. Need for immunization against influenza  - Flu Vaccine QUAD High Dose(Fluad)  3. Adult hypothyroidism  - Lipid panel - TSH - CBC w/Diff/Platelet - Comprehensive Metabolic Panel (CMET)  4. Essential hypertension  - Lipid panel - TSH - CBC w/Diff/Platelet - Comprehensive Metabolic Panel (CMET)  5. Hypercholesterolemia  - Lipid panel - TSH - CBC w/Diff/Platelet - Comprehensive Metabolic Panel (CMET)   Return in about 6 months (around 12/24/2020).        Larry Westrup Cranford Mon, MD  [facility] [phone] (phone) [phone] (fax)  [place]

## 2020-06-24 ENCOUNTER — Other Ambulatory Visit: Payer: Self-pay | Admitting: Family Medicine

## 2020-06-25 ENCOUNTER — Other Ambulatory Visit: Payer: Self-pay

## 2020-06-25 ENCOUNTER — Encounter: Payer: Self-pay | Admitting: Family Medicine

## 2020-06-25 ENCOUNTER — Ambulatory Visit (INDEPENDENT_AMBULATORY_CARE_PROVIDER_SITE_OTHER): Payer: PPO | Admitting: Family Medicine

## 2020-06-25 VITALS — BP 139/60 | HR 55 | Temp 98.6°F | Resp 16 | Ht 70.0 in | Wt 207.0 lb

## 2020-06-25 DIAGNOSIS — Z23 Encounter for immunization: Secondary | ICD-10-CM | POA: Diagnosis not present

## 2020-06-25 DIAGNOSIS — E7800 Pure hypercholesterolemia, unspecified: Secondary | ICD-10-CM | POA: Diagnosis not present

## 2020-06-25 DIAGNOSIS — E039 Hypothyroidism, unspecified: Secondary | ICD-10-CM

## 2020-06-25 DIAGNOSIS — I10 Essential (primary) hypertension: Secondary | ICD-10-CM

## 2020-06-25 DIAGNOSIS — Z0000 Encounter for general adult medical examination without abnormal findings: Secondary | ICD-10-CM | POA: Diagnosis not present

## 2020-06-26 LAB — COMPREHENSIVE METABOLIC PANEL
ALT: 15 IU/L (ref 0–44)
AST: 14 IU/L (ref 0–40)
Albumin/Globulin Ratio: 1.5 (ref 1.2–2.2)
Albumin: 3.9 g/dL (ref 3.7–4.7)
Alkaline Phosphatase: 64 IU/L (ref 44–121)
BUN/Creatinine Ratio: 10 (ref 10–24)
BUN: 16 mg/dL (ref 8–27)
Bilirubin Total: 0.8 mg/dL (ref 0.0–1.2)
CO2: 22 mmol/L (ref 20–29)
Calcium: 8.6 mg/dL (ref 8.6–10.2)
Chloride: 109 mmol/L — ABNORMAL HIGH (ref 96–106)
Creatinine, Ser: 1.61 mg/dL — ABNORMAL HIGH (ref 0.76–1.27)
GFR calc Af Amer: 48 mL/min/{1.73_m2} — ABNORMAL LOW (ref >59)
GFR calc non Af Amer: 42 mL/min/{1.73_m2} — ABNORMAL LOW (ref >59)
Globulin, Total: 2.6 g/dL (ref 1.5–4.5)
Glucose: 84 mg/dL (ref 65–99)
Potassium: 4.6 mmol/L (ref 3.5–5.2)
Sodium: 143 mmol/L (ref 134–144)
Total Protein: 6.5 g/dL (ref 6.0–8.5)

## 2020-06-26 LAB — CBC WITH DIFFERENTIAL/PLATELET
Basophils Absolute: 0 10*3/uL (ref 0.0–0.2)
Basos: 1 %
EOS (ABSOLUTE): 0.3 10*3/uL (ref 0.0–0.4)
Eos: 6 %
Hematocrit: 39.7 % (ref 37.5–51.0)
Hemoglobin: 12.7 g/dL — ABNORMAL LOW (ref 13.0–17.7)
Immature Grans (Abs): 0 10*3/uL (ref 0.0–0.1)
Immature Granulocytes: 0 %
Lymphocytes Absolute: 1.2 10*3/uL (ref 0.7–3.1)
Lymphs: 28 %
MCH: 31.1 pg (ref 26.6–33.0)
MCHC: 32 g/dL (ref 31.5–35.7)
MCV: 97 fL (ref 79–97)
Monocytes Absolute: 0.4 10*3/uL (ref 0.1–0.9)
Monocytes: 9 %
Neutrophils Absolute: 2.4 10*3/uL (ref 1.4–7.0)
Neutrophils: 56 %
Platelets: 131 10*3/uL — ABNORMAL LOW (ref 150–450)
RBC: 4.09 x10E6/uL — ABNORMAL LOW (ref 4.14–5.80)
RDW: 12.6 % (ref 11.6–15.4)
WBC: 4.2 10*3/uL (ref 3.4–10.8)

## 2020-06-26 LAB — LIPID PANEL
Chol/HDL Ratio: 6.1 ratio — ABNORMAL HIGH (ref 0.0–5.0)
Cholesterol, Total: 170 mg/dL (ref 100–199)
HDL: 28 mg/dL — ABNORMAL LOW (ref >39)
LDL Chol Calc (NIH): 129 mg/dL — ABNORMAL HIGH (ref 0–99)
Triglycerides: 70 mg/dL (ref 0–149)
VLDL Cholesterol Cal: 13 mg/dL (ref 5–40)

## 2020-06-26 LAB — TSH: TSH: 5.19 u[IU]/mL — ABNORMAL HIGH (ref 0.450–4.500)

## 2020-07-30 ENCOUNTER — Other Ambulatory Visit: Payer: Self-pay | Admitting: Family Medicine

## 2020-07-30 DIAGNOSIS — I10 Essential (primary) hypertension: Secondary | ICD-10-CM

## 2020-09-04 ENCOUNTER — Other Ambulatory Visit: Payer: Self-pay | Admitting: Family Medicine

## 2020-09-04 DIAGNOSIS — E039 Hypothyroidism, unspecified: Secondary | ICD-10-CM

## 2020-09-12 ENCOUNTER — Other Ambulatory Visit: Payer: Self-pay | Admitting: Family Medicine

## 2020-11-13 ENCOUNTER — Other Ambulatory Visit: Payer: Self-pay | Admitting: Family Medicine

## 2020-11-13 DIAGNOSIS — I10 Essential (primary) hypertension: Secondary | ICD-10-CM

## 2020-12-24 ENCOUNTER — Ambulatory Visit: Payer: Self-pay | Admitting: Family Medicine

## 2020-12-31 NOTE — Progress Notes (Signed)
Established patient visit   Patient: Larry Hernandez   DOB: 1946/03/09   75 y.o. Male  MRN: [id number] Visit Date: 01/01/2021  Today's healthcare provider: Wilhemena Durie, MD   Chief Complaint  Patient presents with  . Hypertension  . Hyperlipidemia  . Hypothyroidism   Subjective Patient comes in today for follow-up.   HPI  Patient comes in today for follow-up.  He is wondering about follow-up for his AAA. Hypertension, follow-up  BP Readings from Last 3 Encounters:  01/01/21 124/72  06/25/20 139/60  02/21/20 (!) 128/56   Wt Readings from Last 3 Encounters:  01/01/21 203 lb (92.1 kg)  06/25/20 207 lb (93.9 kg)  02/21/20 206 lb 9.6 oz (93.7 kg)     He was last seen for hypertension 6 months ago.  BP at that visit was 139/60. Management since that visit includes no medication changes.   He reports good compliance with treatment. He is not having side effects.  He is following a Regular diet. He is not exercising. He does not smoke.  Use of agents associated with hypertension: none.   Outside blood pressures are checked occasionally. Symptoms: No chest pain No chest pressure  No palpitations No syncope  No dyspnea No orthopnea  No paroxysmal nocturnal dyspnea No lower extremity edema   Pertinent labs: Lab Results  Component Value Date   CHOL 170 06/25/2020   HDL 28 (L) 06/25/2020   LDLCALC 129 (H) 06/25/2020   TRIG 70 06/25/2020   CHOLHDL 6.1 (H) 06/25/2020   Lab Results  Component Value Date   NA 143 06/25/2020   K 4.6 06/25/2020   CREATININE 1.61 (H) 06/25/2020   GFRNONAA 42 (L) 06/25/2020   GFRAA 48 (L) 06/25/2020   GLUCOSE 84 06/25/2020     The 10-year ASCVD risk score Mikey Bussing DC Jr., et al., 2013) is: 29.1%    Lipid/Cholesterol, Follow-up  Last lipid panel Other pertinent labs  Lab Results  Component Value Date   CHOL 170 06/25/2020   HDL 28 (L) 06/25/2020   LDLCALC 129 (H) 06/25/2020   TRIG 70 06/25/2020   CHOLHDL 6.1 (H)  06/25/2020   Lab Results  Component Value Date   ALT 15 06/25/2020   AST 14 06/25/2020   PLT 131 (L) 06/25/2020   TSH 5.190 (H) 06/25/2020     He was last seen for this 6 months ago.  Management since that visit includes no medication changes.  He reports good compliance with treatment. He is not having side effects.   Symptoms: No chest pain No chest pressure/discomfort  No dyspnea No lower extremity edema  No numbness or tingling of extremity No orthopnea  No palpitations No paroxysmal nocturnal dyspnea  No speech difficulty No syncope   Current diet: well balanced Current exercise: no regular exercise  The 10-year ASCVD risk score Mikey Bussing DC Jr., et al., 2013) is: 29.1%  Hypothyroid, follow-up  Lab Results  Component Value Date   TSH 5.190 (H) 06/25/2020   TSH 0.372 (L) 11/22/2019   TSH 7.500 (H) 05/24/2019   Wt Readings from Last 3 Encounters:  01/01/21 203 lb (92.1 kg)  06/25/20 207 lb (93.9 kg)  02/21/20 206 lb 9.6 oz (93.7 kg)    He was last seen for hypothyroid 6 months ago.  Management since that visit includes no medication changes. He reports good compliance with treatment. He is not having side effects.       Medications: Outpatient Medications Prior to Visit  Medication Sig  . amLODipine (NORVASC) 5 MG tablet TAKE 1 TABLET(5 MG) BY MOUTH DAILY  . levothyroxine (SYNTHROID) 75 MCG tablet TAKE 1 TABLET(75 MCG) BY MOUTH DAILY  . losartan (COZAAR) 50 MG tablet TAKE 1 TABLET(50 MG) BY MOUTH DAILY  . rosuvastatin (CRESTOR) 20 MG tablet TAKE 1 TABLET(20 MG) BY MOUTH DAILY  . sildenafil (REVATIO) 20 MG tablet Take 1 tablet (20 mg total) by mouth See admin instructions. 1-5 daily as needed   No facility-administered medications prior to visit.    Review of Systems      Objective    BP 124/72   Pulse (!) 54   Temp 98.2 F (36.8 C)   Resp 16   Ht 5\' 10"  (1.778 m)   Wt 203 lb (92.1 kg)   BMI 29.13 kg/m  BP Readings from Last 3 Encounters:   01/01/21 124/72  06/25/20 139/60  02/21/20 (!) 128/56   Wt Readings from Last 3 Encounters:  01/01/21 203 lb (92.1 kg)  06/25/20 207 lb (93.9 kg)  02/21/20 206 lb 9.6 oz (93.7 kg)       Physical Exam Vitals reviewed.  Constitutional:      Appearance: He is well-developed.  HENT:     Head: Normocephalic and atraumatic.     Right Ear: External ear normal.     Left Ear: External ear normal.     Nose: Nose normal.  Eyes:     Conjunctiva/sclera: Conjunctivae normal.     Pupils: Pupils are equal, round, and reactive to light.  Cardiovascular:     Rate and Rhythm: Normal rate and regular rhythm.     Heart sounds: Normal heart sounds.  Pulmonary:     Effort: Pulmonary effort is normal.     Breath sounds: Normal breath sounds.  Abdominal:     General: Bowel sounds are normal.     Palpations: Abdomen is soft.  Genitourinary:    Penis: Normal.      Prostate: Normal.     Rectum: Normal.  Musculoskeletal:        General: Normal range of motion.     Cervical back: Normal range of motion and neck supple.  Skin:    General: Skin is warm and dry.  Neurological:     Mental Status: He is alert and oriented to person, place, and time.  Psychiatric:        Mood and Affect: Mood normal.        Behavior: Behavior normal.        Thought Content: Thought content normal.        Judgment: Judgment normal.       No results found for any visits on 01/01/21.  Assessment & Plan     1. Essential hypertension Good control.  2. Adult hypothyroidism   3. Hypercholesterolemia   4. Abdominal aortic aneurysm (AAA) without rupture ([place]) Of AAA.  Refer back to vascular. - Ambulatory referral to Vascular Surgery   Return in about 6 months (around 07/03/2021).      I, Wilhemena Durie, MD, have reviewed all documentation for this visit. The documentation on 01/05/21 for the exam, diagnosis, procedures, and orders are all accurate and complete.    Syra Sirmons Cranford Mon, MD   [facility] [phone] (phone) [phone] (fax)  [place]

## 2021-01-01 ENCOUNTER — Other Ambulatory Visit: Payer: Self-pay

## 2021-01-01 ENCOUNTER — Encounter: Payer: Self-pay | Admitting: Family Medicine

## 2021-01-01 ENCOUNTER — Ambulatory Visit (INDEPENDENT_AMBULATORY_CARE_PROVIDER_SITE_OTHER): Payer: PPO | Admitting: Family Medicine

## 2021-01-01 VITALS — BP 124/72 | HR 54 | Temp 98.2°F | Resp 16 | Ht 70.0 in | Wt 203.0 lb

## 2021-01-01 DIAGNOSIS — I10 Essential (primary) hypertension: Secondary | ICD-10-CM | POA: Diagnosis not present

## 2021-01-01 DIAGNOSIS — I714 Abdominal aortic aneurysm, without rupture, unspecified: Secondary | ICD-10-CM

## 2021-01-01 DIAGNOSIS — E039 Hypothyroidism, unspecified: Secondary | ICD-10-CM | POA: Diagnosis not present

## 2021-01-01 DIAGNOSIS — E7800 Pure hypercholesterolemia, unspecified: Secondary | ICD-10-CM

## 2021-01-22 NOTE — Progress Notes (Signed)
MRN : [id number]  Larry Hernandez is a 75 y.o. (1946/05/26) male who presents with chief complaint of No chief complaint on file. Larry Hernandez  History of Present Illness:  The patient presents to the office for evaluation of an abdominal aortic aneurysm. The aneurysm was identified remotely.  In 2017 the AAA measured 3.7 cm. Patient denies abdominal pain or unusual back pain, no other abdominal complaints.  No history of an acute onset of painful blue discoloration of the toes.     No family history of AAA.   Patient denies amaurosis fugax or TIA symptoms. There is no history of claudication or rest pain symptoms of the lower extremities.  The patient denies angina or shortness of breath.  Duplex ultrasound shows an AAA that measures 3.40 cm.  Previous duplex of the AAA from 01/2021 showed an AAA of 3.3.  No outpatient medications have been marked as taking for the 01/24/21 encounter (Appointment) with Larry Hernandez, Larry Lory, MD.    Past Medical History:  Diagnosis Date  . Cancer [facility]) 2012   skin caner face and ear  . Hernia, inguinal   . Hyperlipidemia   . Hypertension   . Hypothyroid     Past Surgical History:  Procedure Laterality Date  . BASAL CELL CARCINOMA EXCISION    . COLONOSCOPY  2012  . COLONOSCOPY WITH PROPOFOL N/A 08/09/2019   Procedure: COLONOSCOPY WITH PROPOFOL;  Surgeon: Lucilla Lame, MD;  Location: [facility] ENDOSCOPY;  Service: Endoscopy;  Laterality: N/A;  . HERNIA REPAIR Left 06-13-14   inguinal    Social History Social History   Tobacco Use  . Smoking status: Never Smoker  . Smokeless tobacco: Never Used  Vaping Use  . Vaping Use: Never used  Substance Use Topics  . Alcohol use: No    Alcohol/week: 0.0 standard drinks  . Drug use: No    Family History Family History  Problem Relation Age of Onset  . Kidney disease Father        died @ 77  . Cancer Father        kidney, was removed   . Heart disease Brother        died @ age 44  . Diabetes Son         type 2  No family history of bleeding/clotting disorders, porphyria or autoimmune disease   No Known Allergies   REVIEW OF SYSTEMS (Negative unless checked)  Constitutional: [] Weight loss  [] Fever  [] Chills Cardiac: [] Chest pain   [] Chest pressure   [] Palpitations   [] Shortness of breath when laying flat   [] Shortness of breath with exertion. Vascular:  [] Pain in legs with walking   [] Pain in legs at rest  [] History of DVT   [] Phlebitis   [] Swelling in legs   [] Varicose veins   [] Non-healing ulcers Pulmonary:   [] Uses home oxygen   [] Productive cough   [] Hemoptysis   [] Wheeze  [] COPD   [] Asthma Neurologic:  [] Dizziness   [] Seizures   [] History of stroke   [] History of TIA  [] Aphasia   [] Vissual changes   [] Weakness or numbness in arm   [] Weakness or numbness in leg Musculoskeletal:   [] Joint swelling   [] Joint pain   [] Low back pain Hematologic:  [] Easy bruising  [] Easy bleeding   [] Hypercoagulable state   [] Anemic Gastrointestinal:  [] Diarrhea   [] Vomiting  [] Gastroesophageal reflux/heartburn   [] Difficulty swallowing. Genitourinary:  [] Chronic kidney disease   [] Difficult urination  [] Frequent urination   [] Blood in urine Skin:  []   Rashes   [] Ulcers  Psychological:  [] History of anxiety   []  History of major depression.  Physical Examination  There were no vitals filed for this visit. There is no height or weight on file to calculate BMI. Gen: WD/WN, NAD Head: [place]/AT, No temporalis wasting.  Ear/Nose/Throat: Hearing grossly intact, nares w/o erythema or drainage, poor dentition Eyes: PER, EOMI, sclera nonicteric.  Neck: Supple, no masses.  No bruit or JVD.  Pulmonary:  Good air movement, clear to auscultation bilaterally, no use of accessory muscles.  Cardiac: RRR, normal S1, S2, no Murmurs. Vascular:  Vessel Right Left  Radial Palpable Palpable  Popliteal Palpable Palpable  PT Palpable Palpable  DP Palpable Palpable  Gastrointestinal: soft, non-distended. No guarding/no  peritoneal signs.  Musculoskeletal: M/S 5/5 throughout.  No deformity or atrophy.  Neurologic: CN 2-12 intact. Pain and light touch intact in extremities.  Symmetrical.  Speech is fluent. Motor exam as listed above. Psychiatric: Judgment intact, Mood & affect appropriate for pt's clinical situation. Dermatologic: No rashes or ulcers noted.  No changes consistent with cellulitis.  CBC Lab Results  Component Value Date   WBC 4.2 06/25/2020   HGB 12.7 (L) 06/25/2020   HCT 39.7 06/25/2020   MCV 97 06/25/2020   PLT 131 (L) 06/25/2020    BMET    Component Value Date/Time   NA 143 06/25/2020 1102   NA 141 05/30/2014 1449   K 4.6 06/25/2020 1102   K 4.2 05/30/2014 1449   CL 109 (H) 06/25/2020 1102   CL 109 (H) 05/30/2014 1449   CO2 22 06/25/2020 1102   CO2 28 05/30/2014 1449   GLUCOSE 84 06/25/2020 1102   GLUCOSE 92 07/20/2017 1047   GLUCOSE 96 05/30/2014 1449   BUN 16 06/25/2020 1102   BUN 22 (H) 05/30/2014 1449   CREATININE 1.61 (H) 06/25/2020 1102   CREATININE 1.60 (H) 07/20/2017 1047   CALCIUM 8.6 06/25/2020 1102   CALCIUM 8.1 (L) 05/30/2014 1449   GFRNONAA 42 (L) 06/25/2020 1102   GFRNONAA 43 (L) 07/20/2017 1047   GFRAA 48 (L) 06/25/2020 1102   GFRAA 50 (L) 07/20/2017 1047   CrCl cannot be calculated (Patient's most recent lab result is older than the maximum 21 days allowed.).  COAG No results found for: INR, PROTIME  Radiology No results found.   Assessment/Plan 1. Abdominal aortic aneurysm (AAA) without rupture ([place]) No surgery or intervention at this time. The patient has an asymptomatic abdominal aortic aneurysm that is less than 4 cm in maximal diameter.  I have discussed the natural history of abdominal aortic aneurysm and the small risk of rupture for aneurysm less than 5 cm in size.  However, as these small aneurysms tend to enlarge over time, continued surveillance with ultrasound or CT scan is mandatory.  I have also discussed optimizing medical  management with hypertension and lipid control and the importance of abstinence from tobacco.  The patient is also encouraged to exercise a minimum of 30 minutes 4 times a week.  Should the patient develop new onset abdominal or back pain or signs of peripheral embolization they are instructed to seek medical attention immediately and to alert the physician providing care that they have an aneurysm.  The patient voices their understanding. The patient will return in 24 months with an aortic duplex.  - VAS US AORTA/IVC/ILIACS; Future  2. Primary hypertension Continue antihypertensive medications as already ordered, these medications have been reviewed and there are no changes at this time.   3.  Hypercholesterolemia Continue statin as ordered and reviewed, no changes at this time     Hortencia Pilar, MD  01/22/2021 2:29 PM

## 2021-01-24 ENCOUNTER — Other Ambulatory Visit: Payer: Self-pay

## 2021-01-24 ENCOUNTER — Ambulatory Visit (INDEPENDENT_AMBULATORY_CARE_PROVIDER_SITE_OTHER): Payer: PPO

## 2021-01-24 ENCOUNTER — Encounter (INDEPENDENT_AMBULATORY_CARE_PROVIDER_SITE_OTHER): Payer: Self-pay | Admitting: Vascular Surgery

## 2021-01-24 ENCOUNTER — Ambulatory Visit (INDEPENDENT_AMBULATORY_CARE_PROVIDER_SITE_OTHER): Payer: PPO | Admitting: Vascular Surgery

## 2021-01-24 VITALS — BP 146/67 | HR 56 | Ht 70.0 in

## 2021-01-24 DIAGNOSIS — I723 Aneurysm of iliac artery: Secondary | ICD-10-CM

## 2021-01-24 DIAGNOSIS — E7800 Pure hypercholesterolemia, unspecified: Secondary | ICD-10-CM

## 2021-01-24 DIAGNOSIS — I714 Abdominal aortic aneurysm, without rupture, unspecified: Secondary | ICD-10-CM

## 2021-01-24 DIAGNOSIS — I10 Essential (primary) hypertension: Secondary | ICD-10-CM

## 2021-02-12 DIAGNOSIS — L570 Actinic keratosis: Secondary | ICD-10-CM | POA: Diagnosis not present

## 2021-02-12 DIAGNOSIS — Z85828 Personal history of other malignant neoplasm of skin: Secondary | ICD-10-CM | POA: Diagnosis not present

## 2021-02-12 DIAGNOSIS — D2262 Melanocytic nevi of left upper limb, including shoulder: Secondary | ICD-10-CM | POA: Diagnosis not present

## 2021-02-12 DIAGNOSIS — D225 Melanocytic nevi of trunk: Secondary | ICD-10-CM | POA: Diagnosis not present

## 2021-02-12 DIAGNOSIS — D2261 Melanocytic nevi of right upper limb, including shoulder: Secondary | ICD-10-CM | POA: Diagnosis not present

## 2021-02-12 DIAGNOSIS — D2271 Melanocytic nevi of right lower limb, including hip: Secondary | ICD-10-CM | POA: Diagnosis not present

## 2021-02-25 ENCOUNTER — Other Ambulatory Visit: Payer: Self-pay | Admitting: Family Medicine

## 2021-02-25 DIAGNOSIS — I10 Essential (primary) hypertension: Secondary | ICD-10-CM

## 2021-05-24 ENCOUNTER — Other Ambulatory Visit: Payer: Self-pay | Admitting: Family Medicine

## 2021-05-24 DIAGNOSIS — I10 Essential (primary) hypertension: Secondary | ICD-10-CM

## 2021-06-15 ENCOUNTER — Other Ambulatory Visit: Payer: Self-pay | Admitting: Family Medicine

## 2021-06-15 DIAGNOSIS — E039 Hypothyroidism, unspecified: Secondary | ICD-10-CM

## 2021-06-15 NOTE — Telephone Encounter (Signed)
Requested medication (s) are due for refill today: yes  Requested medication (s) are on the active medication list: yes  Last refill:  09/04/20 #90 1 RF  Future visit scheduled: yes  Notes to clinic:  > 3 months overdue for repeat TSH   Requested Prescriptions  Pending Prescriptions Disp Refills   levothyroxine (SYNTHROID) 75 MCG tablet [Pharmacy Med Name: LEVOTHYROXINE 0.075MG  (75MCG) TABS] 90 tablet 1    Sig: TAKE 1 TABLET(75 MCG) BY MOUTH DAILY     Endocrinology:  Hypothyroid Agents Failed - 06/15/2021  8:09 AM      Failed - TSH needs to be rechecked within 3 months after an abnormal result. Refill until TSH is due.      Failed - TSH in normal range and within 360 days    TSH  Date Value Ref Range Status  06/25/2020 5.190 (H) 0.450 - 4.500 uIU/mL Final          Passed - Valid encounter within last 12 months    Recent Outpatient Visits           5 months ago Essential hypertension   [facility] Jerrol Banana., MD   11 months ago Annual physical exam   [facility] Jerrol Banana., MD   1 year ago Adult hypothyroidism   [facility] Jerrol Banana., MD   2 years ago Annual physical exam   [facility] Jerrol Banana., MD   3 years ago Encounter for annual physical exam   [facility] Jerrol Banana., MD

## 2021-06-26 ENCOUNTER — Other Ambulatory Visit: Payer: Self-pay

## 2021-06-26 ENCOUNTER — Ambulatory Visit (INDEPENDENT_AMBULATORY_CARE_PROVIDER_SITE_OTHER): Payer: PPO | Admitting: Family Medicine

## 2021-06-26 VITALS — BP 133/57 | HR 48 | Temp 98.6°F | Ht 70.0 in | Wt 205.0 lb

## 2021-06-26 DIAGNOSIS — E7800 Pure hypercholesterolemia, unspecified: Secondary | ICD-10-CM

## 2021-06-26 DIAGNOSIS — G4733 Obstructive sleep apnea (adult) (pediatric): Secondary | ICD-10-CM

## 2021-06-26 DIAGNOSIS — L570 Actinic keratosis: Secondary | ICD-10-CM | POA: Diagnosis not present

## 2021-06-26 DIAGNOSIS — Z0000 Encounter for general adult medical examination without abnormal findings: Secondary | ICD-10-CM | POA: Diagnosis not present

## 2021-06-26 DIAGNOSIS — Z23 Encounter for immunization: Secondary | ICD-10-CM

## 2021-06-26 DIAGNOSIS — E039 Hypothyroidism, unspecified: Secondary | ICD-10-CM

## 2021-06-26 DIAGNOSIS — I714 Abdominal aortic aneurysm, without rupture, unspecified: Secondary | ICD-10-CM | POA: Diagnosis not present

## 2021-06-26 DIAGNOSIS — I10 Essential (primary) hypertension: Secondary | ICD-10-CM

## 2021-06-26 NOTE — Progress Notes (Signed)
Complete physical exam   Patient: Larry Hernandez   DOB: Sep 08, 1946   75 y.o. Male  MRN: [id number] Visit Date: 06/26/2021  Today's healthcare provider: Wilhemena Durie, MD   No chief complaint on file.  Subjective    RAIN FRIEDT is a 75 y.o. male who presents today for a complete physical exam. Annual Wellness  for medicare. He reports consuming a general diet. The patient does not participate in regular exercise at present. He generally feels well. He reports sleeping well. He does not have additional problems to discuss today.   HPI    Past Medical History:  Diagnosis Date   Cancer ([place]) 2012   skin caner face and ear   Hernia, inguinal    Hyperlipidemia    Hypertension    Hypothyroid    Past Surgical History:  Procedure Laterality Date   BASAL CELL CARCINOMA EXCISION     COLONOSCOPY  2012   COLONOSCOPY WITH PROPOFOL N/A 08/09/2019   Procedure: COLONOSCOPY WITH PROPOFOL;  Surgeon: Lucilla Lame, MD;  Location: [facility] ENDOSCOPY;  Service: Endoscopy;  Laterality: N/A;   HERNIA REPAIR Left 06-13-14   inguinal   Social History   Socioeconomic History   Marital status: Married    Spouse name: Not on file   Number of children: 1   Years of education: Not on file   Highest education level: Bachelor's degree (e.g., BA, AB, BS)  Occupational History   Occupation: security guard    Comment: part time  Tobacco Use   Smoking status: Never   Smokeless tobacco: Never  Vaping Use   Vaping Use: Never used  Substance and Sexual Activity   Alcohol use: No    Alcohol/week: 0.0 standard drinks   Drug use: No   Sexual activity: Not on file  Other Topics Concern   Not on file  Social History Narrative   Not on file   Social Determinants of Health   Financial Resource Strain: Not on file  Food Insecurity: Not on file  Transportation Needs: Not on file  Physical Activity: Not on file  Stress: Not on file  Social Connections: Not on file  Intimate Partner  Violence: Not on file   Family Status  Relation Name Status   Mother  Deceased   Father  Deceased   Brother  Deceased   Son  Alive   Brother  Alive   Family History  Problem Relation Age of Onset   Kidney disease Father        died @ 37   Cancer Father        kidney, was removed    Heart disease Brother        died @ age 26   Diabetes Son        type 2   No Known Allergies  Patient Care Team: Jerrol Banana., MD as PCP - General (Family Medicine) Thelma Comp, OD as Consulting Physician (Optometry) Dasher, Rayvon Char, MD as Consulting Physician (Dermatology)   Medications: Outpatient Medications Prior to Visit  Medication Sig   amLODipine (NORVASC) 5 MG tablet TAKE 1 TABLET(5 MG) BY MOUTH DAILY   levothyroxine (SYNTHROID) 75 MCG tablet TAKE 1 TABLET(75 MCG) BY MOUTH DAILY   losartan (COZAAR) 50 MG tablet TAKE 1 TABLET(50 MG) BY MOUTH DAILY   rosuvastatin (CRESTOR) 20 MG tablet TAKE 1 TABLET(20 MG) BY MOUTH DAILY   sildenafil (REVATIO) 20 MG tablet Take 1 tablet (20 mg total) by mouth  See admin instructions. 1-5 daily as needed   No facility-administered medications prior to visit.    Review of Systems  Constitutional: Negative.   HENT: Negative.    Eyes: Negative.   Respiratory: Negative.    Cardiovascular: Negative.   Gastrointestinal:  Positive for abdominal distention.  Endocrine: Negative.   Genitourinary: Negative.   Musculoskeletal: Negative.   Skin: Negative.   Allergic/Immunologic: Negative.   Neurological: Negative.   Hematological: Negative.   Psychiatric/Behavioral: Negative.        Objective    BP (!) 133/57 (BP Location: Right Arm, Patient Position: Sitting, Cuff Size: Large)   Pulse (!) 48   Temp 98.6 F (37 C) (Oral)   Ht 5\' 10"  (1.778 m)   Wt 205 lb (93 kg)   SpO2 99%   BMI 29.41 kg/m     Physical Exam Constitutional:      Appearance: Normal appearance. He is normal weight.  HENT:     Head: Normocephalic and atraumatic.      Right Ear: Tympanic membrane, ear canal and external ear normal.     Left Ear: Tympanic membrane, ear canal and external ear normal.     Nose: Nose normal.     Mouth/Throat:     Mouth: Mucous membranes are moist.     Pharynx: Oropharynx is clear.  Eyes:     Extraocular Movements: Extraocular movements intact.     Conjunctiva/sclera: Conjunctivae normal.     Pupils: Pupils are equal, round, and reactive to light.  Cardiovascular:     Rate and Rhythm: Normal rate and regular rhythm.     Pulses: Normal pulses.     Heart sounds: Normal heart sounds.  Pulmonary:     Effort: Pulmonary effort is normal.     Breath sounds: Normal breath sounds.  Abdominal:     General: Abdomen is flat. Bowel sounds are normal.     Palpations: Abdomen is soft.  Musculoskeletal:     Cervical back: Neck supple.  Skin:    General: Skin is warm and dry.     Comments: Fair skin with some solar keratosis  Neurological:     General: No focal deficit present.     Mental Status: He is alert and oriented to person, place, and time. Mental status is at baseline.  Psychiatric:        Mood and Affect: Mood normal.        Behavior: Behavior normal.        Thought Content: Thought content normal.        Judgment: Judgment normal.      Last depression screening scores PHQ 2/9 Scores 06/26/2021 06/25/2020 02/21/2020  PHQ - 2 Score 0 0 0  PHQ- 9 Score 0 0 -   Last fall risk screening Fall Risk  06/26/2021  Falls in the past year? 0  Number falls in past yr: 0  Injury with Fall? 0  Follow up -   Last Audit-C alcohol use screening Alcohol Use Disorder Test (AUDIT) 06/26/2021  1. How often do you have a drink containing alcohol? 0  2. How many drinks containing alcohol do you have on a typical day when you are drinking? 0  3. How often do you have six or more drinks on one occasion? 0  AUDIT-C Score 0  Alcohol Brief Interventions/Follow-up -   A score of 3 or more in women, and 4 or more in men  indicates increased risk for alcohol abuse, EXCEPT if all of the  points are from question 1   No results found for any visits on 06/26/21.  Assessment & Plan    Routine Health Maintenance and Physical Exam  Exercise Activities and Dietary recommendations  Goals      DIET - DECREASE SODA OR JUICE INTAKE     Recommend to cut back on soda intake to no more than 16 oz a day and increase water intake.      DIET - INCREASE WATER INTAKE     Recommend increasing water intake to 4-6 glasses a day.         Immunization History  Administered Date(s) Administered   Fluad Quad(high Dose 65+) 05/24/2019, 06/25/2020   Influenza Split 06/26/2009, 06/10/2011   Influenza, High Dose Seasonal PF 06/09/2016, 05/19/2018   Influenza,inj,Quad PF,6+ Mos 05/26/2013   Influenza-Unspecified 05/10/2015   PFIZER(Purple Top)SARS-COV-2 Vaccination 10/18/2019, 11/08/2019, 06/11/2020   Pneumococcal Conjugate-13 12/20/2014   Pneumococcal Polysaccharide-23 01/06/2012   Tdap 04/19/2009   Zoster, Live 01/06/2012    Health Maintenance  Topic Date Due   Zoster Vaccines- Shingrix (1 of 2) Never done   TETANUS/TDAP  04/20/2019   COVID-19 Vaccine (4 - Booster for Pfizer series) 09/03/2020   INFLUENZA VACCINE  04/08/2021   COLONOSCOPY (Pts 45-78yrs Insurance coverage will need to be confirmed)  08/08/2024   Pneumonia Vaccine 68+ Years old  Completed   Hepatitis C Screening  Completed   HPV VACCINES  Aged Out    Discussed health benefits of physical activity, and encouraged him to engage in regular exercise appropriate for his age and condition.  1. Encounter for annual wellness exam in Medicare patient Will discuss MOST forms next year as patient's health is pretty good but his wife is not a great help.  2. Annual physical exam   3. Primary hypertension  - CBC with Differential/Platelet - Comprehensive metabolic panel - TSH  4. Adult hypothyroidism  - TSH  5. Hypercholesterolemia  -  Comprehensive metabolic panel - Lipid Panel With LDL/HDL Ratio  6. Need for influenza vaccination  - Flu Vaccine QUAD High Dose(Fluad)  7. Solar keratosis History of basal cell and squamous cell Skin cancer followed by dermatology  8. Abdominal aortic aneurysm (AAA) without rupture, unspecified part Followed by thoracic surgery  9. Obstructive sleep apnea syndrome The past   No follow-ups on file.     I, Wilhemena Durie, MD, have reviewed all documentation for this visit. The documentation on 07/05/21 for the exam, diagnosis, procedures, and orders are all accurate and complete.    Zakyla Tonche Cranford Mon, MD  [facility] [phone] (phone) [phone] (fax)  [place]

## 2021-06-27 LAB — COMPREHENSIVE METABOLIC PANEL
ALT: 14 IU/L (ref 0–44)
AST: 18 IU/L (ref 0–40)
Albumin/Globulin Ratio: 1.5 (ref 1.2–2.2)
Albumin: 3.8 g/dL (ref 3.7–4.7)
Alkaline Phosphatase: 67 IU/L (ref 44–121)
BUN/Creatinine Ratio: 12 (ref 10–24)
BUN: 19 mg/dL (ref 8–27)
Bilirubin Total: 0.8 mg/dL (ref 0.0–1.2)
CO2: 24 mmol/L (ref 20–29)
Calcium: 9 mg/dL (ref 8.6–10.2)
Chloride: 109 mmol/L — ABNORMAL HIGH (ref 96–106)
Creatinine, Ser: 1.59 mg/dL — ABNORMAL HIGH (ref 0.76–1.27)
Globulin, Total: 2.6 g/dL (ref 1.5–4.5)
Glucose: 91 mg/dL (ref 70–99)
Potassium: 5.6 mmol/L — ABNORMAL HIGH (ref 3.5–5.2)
Sodium: 142 mmol/L (ref 134–144)
Total Protein: 6.4 g/dL (ref 6.0–8.5)
eGFR: 45 mL/min/{1.73_m2} — ABNORMAL LOW (ref >59)

## 2021-06-27 LAB — CBC WITH DIFFERENTIAL/PLATELET
Basophils Absolute: 0 10*3/uL (ref 0.0–0.2)
Basos: 0 %
EOS (ABSOLUTE): 0.2 10*3/uL (ref 0.0–0.4)
Eos: 4 %
Hematocrit: 41 % (ref 37.5–51.0)
Hemoglobin: 13.6 g/dL (ref 13.0–17.7)
Immature Grans (Abs): 0 10*3/uL (ref 0.0–0.1)
Immature Granulocytes: 0 %
Lymphocytes Absolute: 1 10*3/uL (ref 0.7–3.1)
Lymphs: 21 %
MCH: 30.9 pg (ref 26.6–33.0)
MCHC: 33.2 g/dL (ref 31.5–35.7)
MCV: 93 fL (ref 79–97)
Monocytes Absolute: 0.3 10*3/uL (ref 0.1–0.9)
Monocytes: 7 %
Neutrophils Absolute: 3.1 10*3/uL (ref 1.4–7.0)
Neutrophils: 68 %
Platelets: 132 10*3/uL — ABNORMAL LOW (ref 150–450)
RBC: 4.4 x10E6/uL (ref 4.14–5.80)
RDW: 12.6 % (ref 11.6–15.4)
WBC: 4.6 10*3/uL (ref 3.4–10.8)

## 2021-06-27 LAB — LIPID PANEL WITH LDL/HDL RATIO
Cholesterol, Total: 146 mg/dL (ref 100–199)
HDL: 28 mg/dL — ABNORMAL LOW (ref >39)
LDL Chol Calc (NIH): 103 mg/dL — ABNORMAL HIGH (ref 0–99)
LDL/HDL Ratio: 3.7 ratio — ABNORMAL HIGH (ref 0.0–3.6)
Triglycerides: 77 mg/dL (ref 0–149)
VLDL Cholesterol Cal: 15 mg/dL (ref 5–40)

## 2021-06-27 LAB — TSH: TSH: 2.07 u[IU]/mL (ref 0.450–4.500)

## 2021-08-20 ENCOUNTER — Other Ambulatory Visit: Payer: Self-pay | Admitting: Family Medicine

## 2021-08-20 DIAGNOSIS — I10 Essential (primary) hypertension: Secondary | ICD-10-CM

## 2021-08-20 NOTE — Telephone Encounter (Signed)
Requested Prescriptions  Pending Prescriptions Disp Refills   losartan (COZAAR) 50 MG tablet [Pharmacy Med Name: LOSARTAN 50MG  TABLETS] 90 tablet 1    Sig: TAKE 1 TABLET(50 MG) BY MOUTH DAILY     Cardiovascular:  Angiotensin Receptor Blockers Failed - 08/20/2021  8:49 PM      Failed - Cr in normal range and within 180 days    Creat  Date Value Ref Range Status  07/20/2017 1.60 (H) 0.70 - 1.18 mg/dL Final    Comment:    For patients >74 years of age, the reference limit for Creatinine is approximately 13% higher for people identified as African-American. .    Creatinine, Ser  Date Value Ref Range Status  06/26/2021 1.59 (H) 0.76 - 1.27 mg/dL Final         Failed - K in normal range and within 180 days    Potassium  Date Value Ref Range Status  06/26/2021 5.6 (H) 3.5 - 5.2 mmol/L Final  05/30/2014 4.2 3.5 - 5.1 mmol/L Final         Passed - Patient is not pregnant      Passed - Last BP in normal range    BP Readings from Last 1 Encounters:  06/26/21 (!) 133/57         Passed - Valid encounter within last 6 months    Recent Outpatient Visits          1 month ago Encounter for annual wellness exam in Medicare patient   [facility] Rosanna Randy, Retia Passe., MD   7 months ago Essential hypertension   [facility] Jerrol Banana., MD   1 year ago Annual physical exam   [facility] Jerrol Banana., MD   1 year ago Adult hypothyroidism   [facility] Jerrol Banana., MD   2 years ago Annual physical exam   [facility] Jerrol Banana., MD      Future Appointments            In 4 months Jerrol Banana., MD [facility] [place]

## 2021-09-10 ENCOUNTER — Other Ambulatory Visit: Payer: Self-pay | Admitting: Family Medicine

## 2021-09-19 DIAGNOSIS — H52223 Regular astigmatism, bilateral: Secondary | ICD-10-CM | POA: Diagnosis not present

## 2021-09-19 DIAGNOSIS — H524 Presbyopia: Secondary | ICD-10-CM | POA: Diagnosis not present

## 2021-09-19 DIAGNOSIS — H2513 Age-related nuclear cataract, bilateral: Secondary | ICD-10-CM | POA: Diagnosis not present

## 2021-09-19 DIAGNOSIS — H353131 Nonexudative age-related macular degeneration, bilateral, early dry stage: Secondary | ICD-10-CM | POA: Diagnosis not present

## 2021-09-19 DIAGNOSIS — H5213 Myopia, bilateral: Secondary | ICD-10-CM | POA: Diagnosis not present

## 2021-09-19 DIAGNOSIS — H40023 Open angle with borderline findings, high risk, bilateral: Secondary | ICD-10-CM | POA: Diagnosis not present

## 2021-12-26 ENCOUNTER — Encounter: Payer: Self-pay | Admitting: Family Medicine

## 2021-12-26 ENCOUNTER — Ambulatory Visit (INDEPENDENT_AMBULATORY_CARE_PROVIDER_SITE_OTHER): Payer: PPO | Admitting: Family Medicine

## 2021-12-26 VITALS — BP 123/68 | HR 59 | Temp 97.8°F | Resp 16 | Wt 199.0 lb

## 2021-12-26 DIAGNOSIS — G4733 Obstructive sleep apnea (adult) (pediatric): Secondary | ICD-10-CM

## 2021-12-26 DIAGNOSIS — I10 Essential (primary) hypertension: Secondary | ICD-10-CM

## 2021-12-26 DIAGNOSIS — I714 Abdominal aortic aneurysm, without rupture, unspecified: Secondary | ICD-10-CM | POA: Diagnosis not present

## 2021-12-26 DIAGNOSIS — E7800 Pure hypercholesterolemia, unspecified: Secondary | ICD-10-CM

## 2021-12-26 DIAGNOSIS — E039 Hypothyroidism, unspecified: Secondary | ICD-10-CM

## 2021-12-26 NOTE — Progress Notes (Signed)
?  ? ? ?Established patient visit ? ?I,April Miller,acting as a scribe for Wilhemena Durie, MD.,have documented all relevant documentation on the behalf of Wilhemena Durie, MD,as directed by  Wilhemena Durie, MD while in the presence of Wilhemena Durie, MD. ? ?Patient: Larry Hernandez   DOB: February 20, 1946   76 y.o. Male  MRN: [id number] ?Visit Date: 12/26/2021 ? ?Today's healthcare provider: Wilhemena Durie, MD  ? ?Chief Complaint  ?Patient presents with  ? Follow-up  ? Hypertension  ? ?Subjective  ?  ?HPI  ?Patient continues to do well with diet and exercise.  He feels well and has no complaints.  He is taking his medications as prescribed. ?He has no cardiovascular symptoms and no belly pain. ?Hypertension, follow-up ? ?BP Readings from Last 3 Encounters:  ?12/26/21 123/68  ?06/26/21 (!) 133/57  ?01/24/21 (!) 146/67  ? Wt Readings from Last 3 Encounters:  ?12/26/21 199 lb (90.3 kg)  ?06/26/21 205 lb (93 kg)  ?01/01/21 203 lb (92.1 kg)  ?  ? ?He was last seen for hypertension 7 months ago.  ?BP at that visit was 133/57. ?Management since that visit includes; taking amlodipine 5 mg and losartan 50 mg. ?He reports good compliance with treatment. ?He is not having side effects. none ?He is not exercising. ?He is adherent to low salt diet.   ?Outside blood pressures are normal. ? ?He does not smoke. ? ?Use of agents associated with hypertension: none.  ? ?--------------------------------------------------------------------------------------------------- ? ? ?Medications: ?Outpatient Medications Prior to Visit  ?Medication Sig  ? amLODipine (NORVASC) 5 MG tablet TAKE 1 TABLET(5 MG) BY MOUTH DAILY  ? levothyroxine (SYNTHROID) 75 MCG tablet TAKE 1 TABLET(75 MCG) BY MOUTH DAILY  ? losartan (COZAAR) 50 MG tablet TAKE 1 TABLET(50 MG) BY MOUTH DAILY  ? rosuvastatin (CRESTOR) 20 MG tablet TAKE 1 TABLET(20 MG) BY MOUTH DAILY  ? [DISCONTINUED] sildenafil (REVATIO) 20 MG tablet Take 1 tablet (20 mg total) by mouth See  admin instructions. 1-5 daily as needed  ? ?No facility-administered medications prior to visit.  ? ? ?Review of Systems  ?Constitutional:  Negative for appetite change, chills and fever.  ?Respiratory:  Negative for chest tightness, shortness of breath and wheezing.   ?Cardiovascular:  Negative for chest pain and palpitations.  ?Gastrointestinal:  Negative for abdominal pain, nausea and vomiting.  ? ?Last lipids ?Lab Results  ?Component Value Date  ? CHOL 146 06/26/2021  ? HDL 28 (L) 06/26/2021  ? LDLCALC 103 (H) 06/26/2021  ? TRIG 77 06/26/2021  ? CHOLHDL 6.1 (H) 06/25/2020  ? ?  ?  Objective  ?  ?BP 123/68 (BP Location: Right Arm, Patient Position: Sitting, Cuff Size: Large)   Pulse (!) 59   Temp 97.8 ?F (36.6 ?C) (Temporal)   Resp 16   Wt 199 lb (90.3 kg)   SpO2 97%   BMI 28.55 kg/m?  ?BP Readings from Last 3 Encounters:  ?12/26/21 123/68  ?06/26/21 (!) 133/57  ?01/24/21 (!) 146/67  ? ?Wt Readings from Last 3 Encounters:  ?12/26/21 199 lb (90.3 kg)  ?06/26/21 205 lb (93 kg)  ?01/01/21 203 lb (92.1 kg)  ? ?  ? ?Physical Exam ?Vitals reviewed.  ?Constitutional:   ?   Appearance: He is well-developed.  ?HENT:  ?   Head: Normocephalic and atraumatic.  ?   Right Ear: External ear normal.  ?   Left Ear: External ear normal.  ?   Nose: Nose normal.  ?Eyes:  ?  Conjunctiva/sclera: Conjunctivae normal.  ?   Pupils: Pupils are equal, round, and reactive to light.  ?Cardiovascular:  ?   Rate and Rhythm: Normal rate and regular rhythm.  ?   Heart sounds: Normal heart sounds.  ?Pulmonary:  ?   Effort: Pulmonary effort is normal.  ?   Breath sounds: Normal breath sounds.  ?Abdominal:  ?   General: Bowel sounds are normal.  ?   Palpations: Abdomen is soft.  ?Musculoskeletal:  ?   Cervical back: Normal range of motion and neck supple.  ?   Right lower leg: No edema.  ?   Left lower leg: No edema.  ?Skin: ?   General: Skin is warm and dry.  ?Neurological:  ?   Mental Status: He is alert and oriented to person, place, and  time.  ?Psychiatric:     ?   Mood and Affect: Mood normal.     ?   Behavior: Behavior normal.     ?   Thought Content: Thought content normal.     ?   Judgment: Judgment normal.  ?  ? ? ?No results found for any visits on 12/26/21. ? Assessment & Plan  ?  ? ?1. Essential hypertension ?Excellent control in patient with AAA ?More than 50% 25 minute visit spent in counseling or coordination of care ? ?2. Hypercholesterolemia ?On rosuvastatin 20 ? ?3. Abdominal aortic aneurysm (AAA) without rupture, unspecified part ([place]) ?Followed by vascular surgery ? ?4. Adult hypothyroidism ?Treat to euthyroid TSH ? ?5. Obstructive sleep apnea syndrome ? ?6.  Macular degeneration ? ?No follow-ups on file.  ?   ? ?I, Wilhemena Durie, MD, have reviewed all documentation for this visit. The documentation on 01/01/22 for the exam, diagnosis, procedures, and orders are all accurate and complete. ? ? ? ?Sabree Nuon Cranford Mon, MD  ?[facility] ?[phone] (phone) [phone] (fax) ? ?[facility] ?

## 2022-02-21 ENCOUNTER — Other Ambulatory Visit: Payer: Self-pay | Admitting: Family Medicine

## 2022-02-21 DIAGNOSIS — E039 Hypothyroidism, unspecified: Secondary | ICD-10-CM

## 2022-02-21 DIAGNOSIS — I10 Essential (primary) hypertension: Secondary | ICD-10-CM

## 2022-03-03 DIAGNOSIS — Z23 Encounter for immunization: Secondary | ICD-10-CM | POA: Diagnosis not present

## 2022-05-06 DIAGNOSIS — D2262 Melanocytic nevi of left upper limb, including shoulder: Secondary | ICD-10-CM | POA: Diagnosis not present

## 2022-05-06 DIAGNOSIS — C4441 Basal cell carcinoma of skin of scalp and neck: Secondary | ICD-10-CM | POA: Diagnosis not present

## 2022-05-06 DIAGNOSIS — X32XXXA Exposure to sunlight, initial encounter: Secondary | ICD-10-CM | POA: Diagnosis not present

## 2022-05-06 DIAGNOSIS — L570 Actinic keratosis: Secondary | ICD-10-CM | POA: Diagnosis not present

## 2022-05-06 DIAGNOSIS — D485 Neoplasm of uncertain behavior of skin: Secondary | ICD-10-CM | POA: Diagnosis not present

## 2022-05-06 DIAGNOSIS — D2261 Melanocytic nevi of right upper limb, including shoulder: Secondary | ICD-10-CM | POA: Diagnosis not present

## 2022-05-06 DIAGNOSIS — Z85828 Personal history of other malignant neoplasm of skin: Secondary | ICD-10-CM | POA: Diagnosis not present

## 2022-05-06 DIAGNOSIS — D2272 Melanocytic nevi of left lower limb, including hip: Secondary | ICD-10-CM | POA: Diagnosis not present

## 2022-05-20 ENCOUNTER — Ambulatory Visit (INDEPENDENT_AMBULATORY_CARE_PROVIDER_SITE_OTHER): Payer: PPO | Admitting: Family Medicine

## 2022-05-20 DIAGNOSIS — Z23 Encounter for immunization: Secondary | ICD-10-CM

## 2022-05-20 NOTE — Progress Notes (Signed)
Flu shot only

## 2022-06-30 DIAGNOSIS — C4441 Basal cell carcinoma of skin of scalp and neck: Secondary | ICD-10-CM | POA: Diagnosis not present

## 2022-07-07 ENCOUNTER — Encounter (INDEPENDENT_AMBULATORY_CARE_PROVIDER_SITE_OTHER): Payer: Self-pay

## 2022-07-08 ENCOUNTER — Encounter: Payer: PPO | Admitting: Family Medicine

## 2022-08-26 ENCOUNTER — Encounter: Payer: PPO | Admitting: Physician Assistant

## 2022-10-23 DIAGNOSIS — I10 Essential (primary) hypertension: Secondary | ICD-10-CM | POA: Diagnosis not present

## 2022-10-23 DIAGNOSIS — I714 Abdominal aortic aneurysm, without rupture, unspecified: Secondary | ICD-10-CM | POA: Diagnosis not present

## 2022-10-23 DIAGNOSIS — N1832 Chronic kidney disease, stage 3b: Secondary | ICD-10-CM | POA: Diagnosis not present

## 2022-10-23 DIAGNOSIS — E7800 Pure hypercholesterolemia, unspecified: Secondary | ICD-10-CM | POA: Diagnosis not present

## 2022-10-23 DIAGNOSIS — E039 Hypothyroidism, unspecified: Secondary | ICD-10-CM | POA: Diagnosis not present

## 2022-10-23 DIAGNOSIS — Z0000 Encounter for general adult medical examination without abnormal findings: Secondary | ICD-10-CM | POA: Diagnosis not present

## 2022-10-27 DIAGNOSIS — D2272 Melanocytic nevi of left lower limb, including hip: Secondary | ICD-10-CM | POA: Diagnosis not present

## 2022-10-27 DIAGNOSIS — Z85828 Personal history of other malignant neoplasm of skin: Secondary | ICD-10-CM | POA: Diagnosis not present

## 2022-10-27 DIAGNOSIS — X32XXXA Exposure to sunlight, initial encounter: Secondary | ICD-10-CM | POA: Diagnosis not present

## 2022-10-27 DIAGNOSIS — D2261 Melanocytic nevi of right upper limb, including shoulder: Secondary | ICD-10-CM | POA: Diagnosis not present

## 2022-10-27 DIAGNOSIS — L570 Actinic keratosis: Secondary | ICD-10-CM | POA: Diagnosis not present

## 2022-10-27 DIAGNOSIS — D2262 Melanocytic nevi of left upper limb, including shoulder: Secondary | ICD-10-CM | POA: Diagnosis not present

## 2022-11-25 ENCOUNTER — Other Ambulatory Visit: Payer: Self-pay

## 2022-11-25 ENCOUNTER — Telehealth: Payer: Self-pay | Admitting: Family Medicine

## 2022-11-25 NOTE — Telephone Encounter (Signed)
Albert Lea faxed refill request for the following medications:    rosuvastatin (CRESTOR) 20 MG tablet   Please advise

## 2022-12-09 DIAGNOSIS — E039 Hypothyroidism, unspecified: Secondary | ICD-10-CM | POA: Diagnosis not present

## 2022-12-09 DIAGNOSIS — I10 Essential (primary) hypertension: Secondary | ICD-10-CM | POA: Diagnosis not present

## 2022-12-09 DIAGNOSIS — E785 Hyperlipidemia, unspecified: Secondary | ICD-10-CM | POA: Diagnosis not present

## 2022-12-09 DIAGNOSIS — N200 Calculus of kidney: Secondary | ICD-10-CM | POA: Diagnosis not present

## 2022-12-09 DIAGNOSIS — I714 Abdominal aortic aneurysm, without rupture, unspecified: Secondary | ICD-10-CM | POA: Diagnosis not present

## 2022-12-09 DIAGNOSIS — R8290 Unspecified abnormal findings in urine: Secondary | ICD-10-CM | POA: Diagnosis not present

## 2022-12-09 DIAGNOSIS — N1832 Chronic kidney disease, stage 3b: Secondary | ICD-10-CM | POA: Diagnosis not present

## 2022-12-12 ENCOUNTER — Other Ambulatory Visit: Payer: Self-pay | Admitting: Nephrology

## 2022-12-12 DIAGNOSIS — E785 Hyperlipidemia, unspecified: Secondary | ICD-10-CM

## 2022-12-12 DIAGNOSIS — R8290 Unspecified abnormal findings in urine: Secondary | ICD-10-CM

## 2022-12-12 DIAGNOSIS — N200 Calculus of kidney: Secondary | ICD-10-CM

## 2022-12-12 DIAGNOSIS — N1832 Chronic kidney disease, stage 3b: Secondary | ICD-10-CM

## 2022-12-25 ENCOUNTER — Ambulatory Visit
Admission: RE | Admit: 2022-12-25 | Discharge: 2022-12-25 | Disposition: A | Discharge status: Home / Self Care | Payer: PPO | Source: Ambulatory Visit | Attending: Nephrology | Admitting: Nephrology

## 2022-12-25 DIAGNOSIS — N1330 Unspecified hydronephrosis: Secondary | ICD-10-CM | POA: Diagnosis not present

## 2022-12-25 DIAGNOSIS — N200 Calculus of kidney: Secondary | ICD-10-CM | POA: Diagnosis not present

## 2022-12-25 DIAGNOSIS — N1832 Chronic kidney disease, stage 3b: Secondary | ICD-10-CM | POA: Insufficient documentation

## 2022-12-25 DIAGNOSIS — R8290 Unspecified abnormal findings in urine: Secondary | ICD-10-CM | POA: Diagnosis not present

## 2022-12-25 DIAGNOSIS — E785 Hyperlipidemia, unspecified: Secondary | ICD-10-CM | POA: Diagnosis not present

## 2022-12-29 DIAGNOSIS — H52223 Regular astigmatism, bilateral: Secondary | ICD-10-CM | POA: Diagnosis not present

## 2022-12-29 DIAGNOSIS — H5213 Myopia, bilateral: Secondary | ICD-10-CM | POA: Diagnosis not present

## 2022-12-29 DIAGNOSIS — H353131 Nonexudative age-related macular degeneration, bilateral, early dry stage: Secondary | ICD-10-CM | POA: Diagnosis not present

## 2022-12-29 DIAGNOSIS — H40023 Open angle with borderline findings, high risk, bilateral: Secondary | ICD-10-CM | POA: Diagnosis not present

## 2022-12-29 DIAGNOSIS — H524 Presbyopia: Secondary | ICD-10-CM | POA: Diagnosis not present

## 2022-12-29 DIAGNOSIS — H2513 Age-related nuclear cataract, bilateral: Secondary | ICD-10-CM | POA: Diagnosis not present

## 2023-01-20 ENCOUNTER — Other Ambulatory Visit (INDEPENDENT_AMBULATORY_CARE_PROVIDER_SITE_OTHER): Payer: Self-pay | Admitting: Vascular Surgery

## 2023-01-20 DIAGNOSIS — I714 Abdominal aortic aneurysm, without rupture, unspecified: Secondary | ICD-10-CM

## 2023-01-20 NOTE — Progress Notes (Unsigned)
MRN : [id number]  Larry Hernandez is a 77 y.o. (23-Aug-1946) male who presents with chief complaint of check circulation.  History of Present Illness:   The patient presents to the office for evaluation of an abdominal aortic aneurysm. The aneurysm was identified remotely.  In 2017 the AAA measured 3.7 cm. Patient denies abdominal pain or unusual back pain, no other abdominal complaints.  No history of an acute onset of painful blue discoloration of the toes.      No family history of AAA.    Patient denies amaurosis fugax or TIA symptoms. There is no history of claudication or rest pain symptoms of the lower extremities.  The patient denies angina or shortness of breath.   Duplex ultrasound shows an AAA that measures 3.50 cm.  Previous duplex of the AAA from 01/2021 showed an AAA of 3.4.  No outpatient medications have been marked as taking for the 01/22/23 encounter (Appointment) with Gilda Crease, Latina Craver, MD.    Past Medical History:  Diagnosis Date   Cancer [facility]) 2012   skin caner face and ear   Hernia, inguinal    Hyperlipidemia    Hypertension    Hypothyroid     Past Surgical History:  Procedure Laterality Date   BASAL CELL CARCINOMA EXCISION     COLONOSCOPY  2012   COLONOSCOPY WITH PROPOFOL N/A 08/09/2019   Procedure: COLONOSCOPY WITH PROPOFOL;  Surgeon: Midge Minium, MD;  Location: [facility] ENDOSCOPY;  Service: Endoscopy;  Laterality: N/A;   HERNIA REPAIR Left 06-13-14   inguinal    Social History Social History   Tobacco Use   Smoking status: Never   Smokeless tobacco: Never  Vaping Use   Vaping Use: Never used  Substance Use Topics   Alcohol use: No    Alcohol/week: 0.0 standard drinks of alcohol   Drug use: No    Family History Family History  Problem Relation Age of Onset   Kidney disease Father        died @ 40   Cancer Father        kidney, was removed    Heart disease Brother         died @ age 33   Diabetes Son        type 2    No Known Allergies   REVIEW OF SYSTEMS (Negative unless checked)  Constitutional: [] Weight loss  [] Fever  [] Chills Cardiac: [] Chest pain   [] Chest pressure   [] Palpitations   [] Shortness of breath when laying flat   [] Shortness of breath with exertion. Vascular:  [x] Pain in legs with walking   [] Pain in legs at rest  [] History of DVT   [] Phlebitis   [] Swelling in legs   [] Varicose veins   [] Non-healing ulcers Pulmonary:   [] Uses home oxygen   [] Productive cough   [] Hemoptysis   [] Wheeze  [] COPD   [] Asthma Neurologic:  [] Dizziness   [] Seizures   [] History of stroke   [] History of TIA  [] Aphasia   [] Vissual changes   [] Weakness or numbness in arm   [] Weakness or numbness in leg Musculoskeletal:   []   Joint swelling   [] Joint pain   [] Low back pain Hematologic:  [] Easy bruising  [] Easy bleeding   [] Hypercoagulable state   [] Anemic Gastrointestinal:  [] Diarrhea   [] Vomiting  [] Gastroesophageal reflux/heartburn   [] Difficulty swallowing. Genitourinary:  [] Chronic kidney disease   [] Difficult urination  [] Frequent urination   [] Blood in urine Skin:  [] Rashes   [] Ulcers  Psychological:  [] History of anxiety   []  History of major depression.  Physical Examination  There were no vitals filed for this visit. There is no height or weight on file to calculate BMI. Gen: WD/WN, NAD Head: [place]/AT, No temporalis wasting.  Ear/Nose/Throat: Hearing grossly intact, nares w/o erythema or drainage Eyes: PER, EOMI, sclera nonicteric.  Neck: Supple, no masses.  No bruit or JVD.  Pulmonary:  Good air movement, no audible wheezing, no use of accessory muscles.  Cardiac: RRR, normal S1, S2, no Murmurs. Vascular: Carotid bruit present, no open wounds Vessel Right Left  Radial Palpable Palpable  PT  Palpable Palpable  DP  Palpable  Palpable  Gastrointestinal: soft, non-distended. No guarding/no peritoneal signs.  Musculoskeletal: M/S 5/5 throughout.  No visible  deformity.  Neurologic: CN 2-12 intact. Pain and light touch intact in extremities.  Symmetrical.  Speech is fluent. Motor exam as listed above. Psychiatric: Judgment intact, Mood & affect appropriate for pt's clinical situation. Dermatologic: No rashes or ulcers noted.  No changes consistent with cellulitis.   CBC Lab Results  Component Value Date   WBC 4.6 06/26/2021   HGB 13.6 06/26/2021   HCT 41.0 06/26/2021   MCV 93 06/26/2021   PLT 132 (L) 06/26/2021    BMET    Component Value Date/Time   NA 142 06/26/2021 1122   NA 141 05/30/2014 1449   K 5.6 (H) 06/26/2021 1122   K 4.2 05/30/2014 1449   CL 109 (H) 06/26/2021 1122   CL 109 (H) 05/30/2014 1449   CO2 24 06/26/2021 1122   CO2 28 05/30/2014 1449   GLUCOSE 91 06/26/2021 1122   GLUCOSE 92 07/20/2017 1047   GLUCOSE 96 05/30/2014 1449   BUN 19 06/26/2021 1122   BUN 22 (H) 05/30/2014 1449   CREATININE 1.59 (H) 06/26/2021 1122   CREATININE 1.60 (H) 07/20/2017 1047   CALCIUM 9.0 06/26/2021 1122   CALCIUM 8.1 (L) 05/30/2014 1449   GFRNONAA 42 (L) 06/25/2020 1102   GFRNONAA 43 (L) 07/20/2017 1047   GFRAA 48 (L) 06/25/2020 1102   GFRAA 50 (L) 07/20/2017 1047   CrCl cannot be calculated (Patient's most recent lab result is older than the maximum 21 days allowed.).  COAG No results found for: "INR", "PROTIME"  Radiology US RENAL  Result Date: 12/25/2022 CLINICAL DATA:  Hyperlipidemia.  Chronic renal disease. EXAM: RENAL / URINARY TRACT ULTRASOUND COMPLETE COMPARISON:  None Available. FINDINGS: Right Kidney: Renal measurements: 9.1 x 4.5 x 5.2 cm = volume: 111.3 mL. Increased echogenicity. Left Kidney: Renal measurements: 11.8 x 4.9 x 6.1 cm = volume: 183.7 mL. Increased cortical echogenicity. Contains an 8 mm stone. Mild hydronephrosis. Bladder: Appears normal for degree of bladder distention. Other: None. IMPRESSION: 1. Increased cortical echogenicity in both kidneys consistent with medical renal disease. 2. 8 mm stone in the  left kidney. 3. Mild left hydronephrosis. Electronically Signed   By: Gerome Sam III M.D.   On: 12/25/2022 13:48     Assessment/Plan 1. Infrarenal abdominal aortic aneurysm (AAA) without rupture (HCC) Recommend: No surgery or intervention is indicated at this time.  The patient has an asymptomatic abdominal  aortic aneurysm that is less than 4 cm in maximal diameter.    I have reviewed the natural history of abdominal aortic aneurysm and the small risk of rupture for aneurysm less than 5 cm in size.  However, as these small aneurysms tend to enlarge over time, continued surveillance with ultrasound or CT scan is mandatory.   I have also discussed optimizing medical management with hypertension and lipid control and the importance of abstinence from tobacco.  The patient is also encouraged to exercise a minimum of 30 minutes 4 times a week.   Should the patient develop new onset abdominal or back pain or signs of peripheral embolization they are instructed to seek medical attention immediately and to alert the physician providing care that they have an aneurysm.   The patient voices their understanding.  The patient will return in 24 months with an aortic duplex. - VAS US AORTA/IVC/ILIACS; Future  2. Bilateral carotid artery stenosis Recommend:  Given the patient's asymptomatic subcritical stenosis no further invasive testing or surgery at this time.  Continue antiplatelet therapy as prescribed Continue management of CAD, HTN and Hyperlipidemia Healthy heart diet,  encouraged exercise at least 4 times per week Follow up in 24 months with duplex ultrasound and physical exam  - VAS US CAROTID; Future  3. Primary hypertension Continue antihypertensive medications as already ordered, these medications have been reviewed and there are no changes at this time.  4. Hypercholesterolemia Continue statin as ordered and reviewed, no changes at this time  5. Adult  hypothyroidism Continue hormone replacement as ordered and reviewed, no changes at this time    Levora Dredge, MD  01/20/2023 12:11 PM

## 2023-01-22 ENCOUNTER — Ambulatory Visit (INDEPENDENT_AMBULATORY_CARE_PROVIDER_SITE_OTHER): Payer: PPO | Admitting: Vascular Surgery

## 2023-01-22 ENCOUNTER — Encounter (INDEPENDENT_AMBULATORY_CARE_PROVIDER_SITE_OTHER): Payer: Self-pay | Admitting: Vascular Surgery

## 2023-01-22 ENCOUNTER — Ambulatory Visit (INDEPENDENT_AMBULATORY_CARE_PROVIDER_SITE_OTHER): Payer: PPO

## 2023-01-22 VITALS — BP 136/71 | HR 49 | Resp 18 | Ht 70.0 in | Wt 210.5 lb

## 2023-01-22 DIAGNOSIS — E7800 Pure hypercholesterolemia, unspecified: Secondary | ICD-10-CM | POA: Diagnosis not present

## 2023-01-22 DIAGNOSIS — I7143 Infrarenal abdominal aortic aneurysm, without rupture: Secondary | ICD-10-CM

## 2023-01-22 DIAGNOSIS — I714 Abdominal aortic aneurysm, without rupture, unspecified: Secondary | ICD-10-CM | POA: Diagnosis not present

## 2023-01-22 DIAGNOSIS — I6529 Occlusion and stenosis of unspecified carotid artery: Secondary | ICD-10-CM | POA: Insufficient documentation

## 2023-01-22 DIAGNOSIS — I10 Essential (primary) hypertension: Secondary | ICD-10-CM | POA: Diagnosis not present

## 2023-01-22 DIAGNOSIS — I6523 Occlusion and stenosis of bilateral carotid arteries: Secondary | ICD-10-CM | POA: Diagnosis not present

## 2023-01-22 DIAGNOSIS — E039 Hypothyroidism, unspecified: Secondary | ICD-10-CM | POA: Diagnosis not present

## 2023-02-04 DIAGNOSIS — E785 Hyperlipidemia, unspecified: Secondary | ICD-10-CM | POA: Diagnosis not present

## 2023-02-04 DIAGNOSIS — E039 Hypothyroidism, unspecified: Secondary | ICD-10-CM | POA: Diagnosis not present

## 2023-02-04 DIAGNOSIS — I714 Abdominal aortic aneurysm, without rupture, unspecified: Secondary | ICD-10-CM | POA: Diagnosis not present

## 2023-02-04 DIAGNOSIS — N200 Calculus of kidney: Secondary | ICD-10-CM | POA: Diagnosis not present

## 2023-02-04 DIAGNOSIS — I10 Essential (primary) hypertension: Secondary | ICD-10-CM | POA: Diagnosis not present

## 2023-02-04 DIAGNOSIS — R8290 Unspecified abnormal findings in urine: Secondary | ICD-10-CM | POA: Diagnosis not present

## 2023-02-04 DIAGNOSIS — N1832 Chronic kidney disease, stage 3b: Secondary | ICD-10-CM | POA: Diagnosis not present

## 2023-02-10 DIAGNOSIS — E039 Hypothyroidism, unspecified: Secondary | ICD-10-CM | POA: Diagnosis not present

## 2023-02-10 DIAGNOSIS — E785 Hyperlipidemia, unspecified: Secondary | ICD-10-CM | POA: Diagnosis not present

## 2023-02-10 DIAGNOSIS — R8290 Unspecified abnormal findings in urine: Secondary | ICD-10-CM | POA: Diagnosis not present

## 2023-02-10 DIAGNOSIS — I714 Abdominal aortic aneurysm, without rupture, unspecified: Secondary | ICD-10-CM | POA: Diagnosis not present

## 2023-02-10 DIAGNOSIS — N1832 Chronic kidney disease, stage 3b: Secondary | ICD-10-CM | POA: Diagnosis not present

## 2023-02-10 DIAGNOSIS — N200 Calculus of kidney: Secondary | ICD-10-CM | POA: Diagnosis not present

## 2023-02-10 DIAGNOSIS — E875 Hyperkalemia: Secondary | ICD-10-CM | POA: Diagnosis not present

## 2023-02-10 DIAGNOSIS — I10 Essential (primary) hypertension: Secondary | ICD-10-CM | POA: Diagnosis not present

## 2023-04-25 ENCOUNTER — Emergency Department: Payer: PPO

## 2023-04-25 ENCOUNTER — Other Ambulatory Visit: Payer: Self-pay

## 2023-04-25 ENCOUNTER — Emergency Department
Admission: EM | Admit: 2023-04-25 | Discharge: 2023-04-26 | Disposition: A | Discharge status: Home / Self Care | Payer: PPO | Attending: Emergency Medicine | Admitting: Emergency Medicine

## 2023-04-25 DIAGNOSIS — R42 Dizziness and giddiness: Secondary | ICD-10-CM | POA: Diagnosis not present

## 2023-04-25 DIAGNOSIS — E039 Hypothyroidism, unspecified: Secondary | ICD-10-CM | POA: Diagnosis not present

## 2023-04-25 DIAGNOSIS — S51811A Laceration without foreign body of right forearm, initial encounter: Secondary | ICD-10-CM | POA: Insufficient documentation

## 2023-04-25 DIAGNOSIS — W01198A Fall on same level from slipping, tripping and stumbling with subsequent striking against other object, initial encounter: Secondary | ICD-10-CM | POA: Diagnosis not present

## 2023-04-25 DIAGNOSIS — M4801 Spinal stenosis, occipito-atlanto-axial region: Secondary | ICD-10-CM | POA: Diagnosis not present

## 2023-04-25 DIAGNOSIS — G9389 Other specified disorders of brain: Secondary | ICD-10-CM | POA: Diagnosis not present

## 2023-04-25 DIAGNOSIS — M47812 Spondylosis without myelopathy or radiculopathy, cervical region: Secondary | ICD-10-CM | POA: Diagnosis not present

## 2023-04-25 DIAGNOSIS — S0990XA Unspecified injury of head, initial encounter: Secondary | ICD-10-CM | POA: Diagnosis not present

## 2023-04-25 DIAGNOSIS — W19XXXA Unspecified fall, initial encounter: Secondary | ICD-10-CM

## 2023-04-25 DIAGNOSIS — I10 Essential (primary) hypertension: Secondary | ICD-10-CM | POA: Insufficient documentation

## 2023-04-25 DIAGNOSIS — M5030 Other cervical disc degeneration, unspecified cervical region: Secondary | ICD-10-CM | POA: Diagnosis not present

## 2023-04-25 LAB — CBC
HCT: 39.3 % (ref 39.0–52.0)
Hemoglobin: 12.6 g/dL — ABNORMAL LOW (ref 13.0–17.0)
MCH: 31.4 pg (ref 26.0–34.0)
MCHC: 32.1 g/dL (ref 30.0–36.0)
MCV: 98 fL (ref 80.0–100.0)
Platelets: 141 10*3/uL — ABNORMAL LOW (ref 150–400)
RBC: 4.01 MIL/uL — ABNORMAL LOW (ref 4.22–5.81)
RDW: 12.8 % (ref 11.5–15.5)
WBC: 7 10*3/uL (ref 4.0–10.5)
nRBC: 0 % (ref 0.0–0.2)

## 2023-04-25 LAB — URINALYSIS, ROUTINE W REFLEX MICROSCOPIC
Bacteria, UA: NONE SEEN
Bilirubin Urine: NEGATIVE
Glucose, UA: NEGATIVE mg/dL
Hgb urine dipstick: NEGATIVE
Ketones, ur: NEGATIVE mg/dL
Leukocytes,Ua: NEGATIVE
Nitrite: NEGATIVE
Protein, ur: 30 mg/dL — AB
Specific Gravity, Urine: 1.028 (ref 1.005–1.030)
pH: 5 (ref 5.0–8.0)

## 2023-04-25 LAB — BASIC METABOLIC PANEL
Anion gap: 9 (ref 5–15)
BUN: 27 mg/dL — ABNORMAL HIGH (ref 8–23)
CO2: 23 mmol/L (ref 22–32)
Calcium: 8.4 mg/dL — ABNORMAL LOW (ref 8.9–10.3)
Chloride: 108 mmol/L (ref 98–111)
Creatinine, Ser: 1.88 mg/dL — ABNORMAL HIGH (ref 0.61–1.24)
GFR, Estimated: 37 mL/min — ABNORMAL LOW (ref >60)
Glucose, Bld: 107 mg/dL — ABNORMAL HIGH (ref 70–99)
Potassium: 4.2 mmol/L (ref 3.5–5.1)
Sodium: 140 mmol/L (ref 135–145)

## 2023-04-25 NOTE — ED Triage Notes (Addendum)
Pt reports he became dizzy today around 1745 and fell, hit the right side of his head and right arm on a display rack at [employer]. Denies LOC, denies blood thinners. Reports he was able to get up on his own and the dizziness has since subsided.

## 2023-04-26 NOTE — ED Provider Notes (Signed)
[facility] Provider Note    Event Date/Time   First MD Initiated Contact with Patient 04/25/23 2343     (approximate)   History   Fall   HPI  Larry Hernandez is a 77 y.o. 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  He was in his regular state of health, no recent illnesses, when he was standing at the doorway felt lightheaded, tried to grab onto something to steady himself and fell striking his head.  No loss of consciousness.  He also scraped his right forearm.  He bumped his right hip.  He was able to get up and walk afterwards.  He dizziness subsided after couple of minutes.  He has had several episodes of dizziness in the past, this is not new for him.  He had no associated chest pain, palpitations, shortness of breath and no recent illnesses.  He feels completely normal now.  He does not take blood thinner.   External Medical Documents Reviewed: Family medicine note in April 2023 documented past medical history and medications      Physical Exam   Triage Vital Signs: ED Triage Vitals  Encounter Vitals Group     BP 04/25/23 2045 (!) 146/81     Systolic BP Percentile --      Diastolic BP Percentile --      Pulse Rate 04/25/23 2045 73     Resp 04/25/23 2045 16     Temp 04/25/23 2045 98.9 F (37.2 C)     Temp Source 04/25/23 2045 Oral     SpO2 04/25/23 2045 98 %     Weight --      Height --      Head Circumference --      Peak Flow --      Pain Score 04/25/23 2043 1     Pain Loc --      Pain Education --      Exclude from Growth Chart --     Most recent vital signs: Vitals:   04/25/23 2045  BP: (!) 146/81  Pulse: 73  Resp: 16  Temp: 98.9 F (37.2 C)  SpO2: 98%    General: Awake, no distress.  CV:  Good peripheral perfusion.  Resp:  Normal effort.  Abd:  No distention.  Other:  Awake alert pleasant  gentleman no acute distress slightly hypertensive otherwise vital signs normal.  He has a very small superficial abrasion to the right side of his scalp, temporal area, hemostatic.  He also has a skin tear to his right forearm.  He has no focal neurologic deficits including dysarthria facial asymmetry, motor or sensory deficits and his gait is normal.   ED Results / Procedures / Treatments   Labs (all labs ordered are listed, but only abnormal results are displayed) Labs Reviewed  BASIC METABOLIC PANEL - Abnormal; Notable for the following components:      Result Value   Glucose, Bld 107 (*)    BUN 27 (*)    Creatinine, Ser 1.88 (*)    Calcium 8.4 (*)    GFR, Estimated 37 (*)    All other components within normal limits  CBC - Abnormal; Notable for the following components:   RBC 4.01 (*)    Hemoglobin 12.6 (*)    Platelets 141 (*)    All other components within normal limits  URINALYSIS,  ROUTINE W REFLEX MICROSCOPIC - Abnormal; Notable for the following components:   Color, Urine YELLOW (*)    APPearance CLEAR (*)    Protein, ur 30 (*)    All other components within normal limits     I ordered and reviewed the above labs they are notable for his creatinine of 1.88 not markedly different from the 1.6 obtained in the past  EKG  ED ECG REPORT I, Pilar Jarvis, the attending physician, personally viewed and interpreted this ECG.   Date: 04/26/2023  EKG Time: 2050  Rate: 67  Rhythm: nsr  Axis: lad  Intervals:rbbb  ST&T Change: no stemi    RADIOLOGY I independently reviewed and interpreted CT of the head see no obvious bleeding or midline shift I also reviewed radiologist's formal read.   PROCEDURES:  Critical Care performed: No  Procedures   MEDICATIONS ORDERED IN ED: Medications - No data to display  IMPRESSION / MDM / ASSESSMENT AND PLAN / ED COURSE  I reviewed the triage vital signs and the nursing notes.                                Patient's  presentation is most consistent with acute presentation with potential threat to life or bodily function.  Differential diagnosis includes, but is not limited to, fall, ICH, skull fracture, dysrhythmia, stroke   The patient is on the cardiac monitor to evaluate for evidence of arrhythmia and/or significant heart rate changes.  MDM:   Patient with some dizziness leading to a fall without loss of consciousness with head strike, given his age and head injury will check for ICH with a CT of the head which looks normal.  CT of the cervical spine normal as well.  Other signs of injury including skin tear to the forearm no bony tenderness and full active range of motion, as well as an injury to the right hip able to bear weight and has full active range of motion doubt fracture dislocation in those areas.  He had no other presyncopal symptoms to suggest heart attack, PE, infectious symptoms, dysrhythmias/palpitation preceding his fall and has occasional lightheadedness associated with standing for a long time at his workplace.  I doubt stroke or other emergency condition leading to his fall.  He feels completely back to normal now.  Given his unremarkable workup, patient to be discharged now with close PMD follow-up return precautions given.        FINAL CLINICAL IMPRESSION(S) / ED DIAGNOSES   Final diagnoses:  Fall, initial encounter  Injury of head, initial encounter     Rx / DC Orders   ED Discharge Orders     None        Note:  This document was prepared using Dragon voice recognition software and may include unintentional dictation errors.    Pilar Jarvis, MD 04/26/23 [phone]

## 2023-04-26 NOTE — Discharge Instructions (Addendum)
Take Tylenol 650 mg every 6 hours as needed for headache and pain.  Fortunately your evaluation in Emergency Department did not show any life-threatening injuries like broken bones or bleeding inside of the brain.  There was an incidental finding suggesting an meningioma in your head which is usually a benign condition for which she should speak with your doctor about further testing as needed.   Thank you for choosing [place] for your health care today!  Please see your primary doctor this week for a follow up appointment.   If you have any new, worsening, or unexpected symptoms call your doctor right away or come back to the emergency department for reevaluation.  It was my pleasure to care for you today.   Daneil Dan Modesto Charon, MD

## 2023-04-28 DIAGNOSIS — R42 Dizziness and giddiness: Secondary | ICD-10-CM | POA: Diagnosis not present

## 2023-04-28 DIAGNOSIS — E7800 Pure hypercholesterolemia, unspecified: Secondary | ICD-10-CM | POA: Diagnosis not present

## 2023-04-28 DIAGNOSIS — E039 Hypothyroidism, unspecified: Secondary | ICD-10-CM | POA: Diagnosis not present

## 2023-04-28 DIAGNOSIS — W01198A Fall on same level from slipping, tripping and stumbling with subsequent striking against other object, initial encounter: Secondary | ICD-10-CM | POA: Diagnosis not present

## 2023-04-28 DIAGNOSIS — N1832 Chronic kidney disease, stage 3b: Secondary | ICD-10-CM | POA: Diagnosis not present

## 2023-04-28 DIAGNOSIS — I10 Essential (primary) hypertension: Secondary | ICD-10-CM | POA: Diagnosis not present

## 2023-04-28 DIAGNOSIS — Y92009 Unspecified place in unspecified non-institutional (private) residence as the place of occurrence of the external cause: Secondary | ICD-10-CM | POA: Diagnosis not present

## 2023-05-04 ENCOUNTER — Telehealth: Payer: Self-pay

## 2023-05-04 NOTE — Telephone Encounter (Signed)
Transition Care Management Unsuccessful Follow-up Telephone Call  Date of discharge and from where:  04/26/2023 [facility]  Attempts:  1st Attempt  Reason for unsuccessful TCM follow-up call:  Left voice message  Jamesrobert Ohanesian [facility]  [facility] Population Health Community Resource Care Guide   [email or web address]  ?? [phone]   Website: [email or web address]  [email or web address]

## 2023-05-05 ENCOUNTER — Telehealth: Payer: Self-pay

## 2023-05-05 NOTE — Telephone Encounter (Signed)
Transition Care Management Unsuccessful Follow-up Telephone Call  Date of discharge and from where:  04/26/2023 [facility]  Attempts:  2nd Attempt  Reason for unsuccessful TCM follow-up call:  Left voice message  Sophie Tamez [facility]  [facility] Population Health Community Resource Care Guide   [email or web address]  ?? [phone]   Website: [email or web address]  [email or web address]

## 2023-05-06 DIAGNOSIS — R42 Dizziness and giddiness: Secondary | ICD-10-CM | POA: Diagnosis not present

## 2023-05-06 DIAGNOSIS — W19XXXA Unspecified fall, initial encounter: Secondary | ICD-10-CM | POA: Diagnosis not present

## 2023-05-14 DIAGNOSIS — N1832 Chronic kidney disease, stage 3b: Secondary | ICD-10-CM | POA: Diagnosis not present

## 2023-05-14 DIAGNOSIS — I4510 Unspecified right bundle-branch block: Secondary | ICD-10-CM | POA: Diagnosis not present

## 2023-05-14 DIAGNOSIS — R42 Dizziness and giddiness: Secondary | ICD-10-CM | POA: Diagnosis not present

## 2023-05-14 DIAGNOSIS — I10 Essential (primary) hypertension: Secondary | ICD-10-CM | POA: Diagnosis not present

## 2023-05-14 DIAGNOSIS — Z9181 History of falling: Secondary | ICD-10-CM | POA: Diagnosis not present

## 2023-05-14 DIAGNOSIS — E7800 Pure hypercholesterolemia, unspecified: Secondary | ICD-10-CM | POA: Diagnosis not present

## 2023-05-19 DIAGNOSIS — R42 Dizziness and giddiness: Secondary | ICD-10-CM | POA: Diagnosis not present

## 2023-05-21 DIAGNOSIS — R42 Dizziness and giddiness: Secondary | ICD-10-CM | POA: Diagnosis not present

## 2023-07-07 DIAGNOSIS — R42 Dizziness and giddiness: Secondary | ICD-10-CM | POA: Diagnosis not present

## 2023-07-07 DIAGNOSIS — R000 Tachycardia, unspecified: Secondary | ICD-10-CM | POA: Diagnosis not present

## 2023-07-07 DIAGNOSIS — W19XXXA Unspecified fall, initial encounter: Secondary | ICD-10-CM | POA: Diagnosis not present

## 2023-07-16 DIAGNOSIS — I4510 Unspecified right bundle-branch block: Secondary | ICD-10-CM | POA: Diagnosis not present

## 2023-07-16 DIAGNOSIS — E7800 Pure hypercholesterolemia, unspecified: Secondary | ICD-10-CM | POA: Diagnosis not present

## 2023-07-16 DIAGNOSIS — I10 Essential (primary) hypertension: Secondary | ICD-10-CM | POA: Diagnosis not present

## 2023-07-16 DIAGNOSIS — I471 Supraventricular tachycardia, unspecified: Secondary | ICD-10-CM | POA: Diagnosis not present

## 2023-07-29 DIAGNOSIS — I10 Essential (primary) hypertension: Secondary | ICD-10-CM | POA: Diagnosis not present

## 2023-07-29 DIAGNOSIS — E039 Hypothyroidism, unspecified: Secondary | ICD-10-CM | POA: Diagnosis not present

## 2023-07-29 DIAGNOSIS — I714 Abdominal aortic aneurysm, without rupture, unspecified: Secondary | ICD-10-CM | POA: Diagnosis not present

## 2023-07-29 DIAGNOSIS — E7800 Pure hypercholesterolemia, unspecified: Secondary | ICD-10-CM | POA: Diagnosis not present

## 2023-07-29 DIAGNOSIS — I471 Supraventricular tachycardia, unspecified: Secondary | ICD-10-CM | POA: Diagnosis not present

## 2023-07-29 DIAGNOSIS — N1832 Chronic kidney disease, stage 3b: Secondary | ICD-10-CM | POA: Diagnosis not present

## 2023-08-11 DIAGNOSIS — I10 Essential (primary) hypertension: Secondary | ICD-10-CM | POA: Diagnosis not present

## 2023-08-11 DIAGNOSIS — E785 Hyperlipidemia, unspecified: Secondary | ICD-10-CM | POA: Diagnosis not present

## 2023-08-11 DIAGNOSIS — E875 Hyperkalemia: Secondary | ICD-10-CM | POA: Diagnosis not present

## 2023-08-11 DIAGNOSIS — N200 Calculus of kidney: Secondary | ICD-10-CM | POA: Diagnosis not present

## 2023-08-11 DIAGNOSIS — R8290 Unspecified abnormal findings in urine: Secondary | ICD-10-CM | POA: Diagnosis not present

## 2023-08-11 DIAGNOSIS — N1832 Chronic kidney disease, stage 3b: Secondary | ICD-10-CM | POA: Diagnosis not present

## 2023-08-11 DIAGNOSIS — I714 Abdominal aortic aneurysm, without rupture, unspecified: Secondary | ICD-10-CM | POA: Diagnosis not present

## 2023-08-11 DIAGNOSIS — E039 Hypothyroidism, unspecified: Secondary | ICD-10-CM | POA: Diagnosis not present

## 2023-08-18 DIAGNOSIS — N1832 Chronic kidney disease, stage 3b: Secondary | ICD-10-CM | POA: Diagnosis not present

## 2023-08-18 DIAGNOSIS — I471 Supraventricular tachycardia, unspecified: Secondary | ICD-10-CM | POA: Diagnosis not present

## 2023-08-18 DIAGNOSIS — E039 Hypothyroidism, unspecified: Secondary | ICD-10-CM | POA: Diagnosis not present

## 2023-08-18 DIAGNOSIS — N200 Calculus of kidney: Secondary | ICD-10-CM | POA: Diagnosis not present

## 2023-08-18 DIAGNOSIS — E785 Hyperlipidemia, unspecified: Secondary | ICD-10-CM | POA: Diagnosis not present

## 2023-08-18 DIAGNOSIS — E875 Hyperkalemia: Secondary | ICD-10-CM | POA: Diagnosis not present

## 2023-08-18 DIAGNOSIS — I714 Abdominal aortic aneurysm, without rupture, unspecified: Secondary | ICD-10-CM | POA: Diagnosis not present

## 2023-08-18 DIAGNOSIS — I10 Essential (primary) hypertension: Secondary | ICD-10-CM | POA: Diagnosis not present

## 2023-12-02 DIAGNOSIS — Z0000 Encounter for general adult medical examination without abnormal findings: Secondary | ICD-10-CM | POA: Diagnosis not present

## 2023-12-02 DIAGNOSIS — I714 Abdominal aortic aneurysm, without rupture, unspecified: Secondary | ICD-10-CM | POA: Diagnosis not present

## 2023-12-02 DIAGNOSIS — E7800 Pure hypercholesterolemia, unspecified: Secondary | ICD-10-CM | POA: Diagnosis not present

## 2023-12-02 DIAGNOSIS — I10 Essential (primary) hypertension: Secondary | ICD-10-CM | POA: Diagnosis not present

## 2023-12-02 DIAGNOSIS — N1832 Chronic kidney disease, stage 3b: Secondary | ICD-10-CM | POA: Diagnosis not present

## 2023-12-02 DIAGNOSIS — E039 Hypothyroidism, unspecified: Secondary | ICD-10-CM | POA: Diagnosis not present

## 2024-01-13 DIAGNOSIS — I471 Supraventricular tachycardia, unspecified: Secondary | ICD-10-CM | POA: Diagnosis not present

## 2024-01-13 DIAGNOSIS — I4510 Unspecified right bundle-branch block: Secondary | ICD-10-CM | POA: Diagnosis not present

## 2024-01-13 DIAGNOSIS — E7800 Pure hypercholesterolemia, unspecified: Secondary | ICD-10-CM | POA: Diagnosis not present

## 2024-01-13 DIAGNOSIS — N1832 Chronic kidney disease, stage 3b: Secondary | ICD-10-CM | POA: Diagnosis not present

## 2024-01-13 DIAGNOSIS — I10 Essential (primary) hypertension: Secondary | ICD-10-CM | POA: Diagnosis not present

## 2024-02-10 DIAGNOSIS — N200 Calculus of kidney: Secondary | ICD-10-CM | POA: Diagnosis not present

## 2024-02-10 DIAGNOSIS — N1832 Chronic kidney disease, stage 3b: Secondary | ICD-10-CM | POA: Diagnosis not present

## 2024-02-10 DIAGNOSIS — I714 Abdominal aortic aneurysm, without rupture, unspecified: Secondary | ICD-10-CM | POA: Diagnosis not present

## 2024-02-10 DIAGNOSIS — E785 Hyperlipidemia, unspecified: Secondary | ICD-10-CM | POA: Diagnosis not present

## 2024-02-10 DIAGNOSIS — E875 Hyperkalemia: Secondary | ICD-10-CM | POA: Diagnosis not present

## 2024-02-10 DIAGNOSIS — E039 Hypothyroidism, unspecified: Secondary | ICD-10-CM | POA: Diagnosis not present

## 2024-02-10 DIAGNOSIS — I10 Essential (primary) hypertension: Secondary | ICD-10-CM | POA: Diagnosis not present

## 2024-02-10 DIAGNOSIS — I471 Supraventricular tachycardia, unspecified: Secondary | ICD-10-CM | POA: Diagnosis not present

## 2024-02-16 ENCOUNTER — Other Ambulatory Visit: Payer: Self-pay | Admitting: Nephrology

## 2024-02-16 DIAGNOSIS — I714 Abdominal aortic aneurysm, without rupture, unspecified: Secondary | ICD-10-CM | POA: Diagnosis not present

## 2024-02-16 DIAGNOSIS — N200 Calculus of kidney: Secondary | ICD-10-CM | POA: Diagnosis not present

## 2024-02-16 DIAGNOSIS — I10 Essential (primary) hypertension: Secondary | ICD-10-CM | POA: Diagnosis not present

## 2024-02-16 DIAGNOSIS — N1339 Other hydronephrosis: Secondary | ICD-10-CM

## 2024-02-16 DIAGNOSIS — E875 Hyperkalemia: Secondary | ICD-10-CM | POA: Diagnosis not present

## 2024-02-16 DIAGNOSIS — I471 Supraventricular tachycardia, unspecified: Secondary | ICD-10-CM | POA: Diagnosis not present

## 2024-02-16 DIAGNOSIS — N1832 Chronic kidney disease, stage 3b: Secondary | ICD-10-CM

## 2024-02-16 DIAGNOSIS — E039 Hypothyroidism, unspecified: Secondary | ICD-10-CM | POA: Diagnosis not present

## 2024-02-16 DIAGNOSIS — E785 Hyperlipidemia, unspecified: Secondary | ICD-10-CM | POA: Diagnosis not present

## 2024-03-01 ENCOUNTER — Telehealth: Payer: Self-pay | Admitting: *Deleted

## 2024-03-01 NOTE — Telephone Encounter (Signed)
 Previous note entered in error.

## 2024-03-01 NOTE — Telephone Encounter (Deleted)
 CRITICAL VALUE STICKER  CRITICAL VALUE: AST [id number]  RECEIVER (on-site recipient of call): Sandi RN  DATE & TIME NOTIFIED: 03/01/2024;0945  MESSENGER (representative from lab):[place]  MD NOTIFIED: Dr. Sherrod + RN  TIME OF NOTIFICATION:0955  RESPONSE: Information acknowledged

## 2024-03-03 ENCOUNTER — Telehealth: Payer: Self-pay | Admitting: Medical Oncology

## 2024-03-03 NOTE — Telephone Encounter (Addendum)
 Error-this pt was not called.

## 2024-04-26 DIAGNOSIS — H524 Presbyopia: Secondary | ICD-10-CM | POA: Diagnosis not present

## 2024-04-26 DIAGNOSIS — H5213 Myopia, bilateral: Secondary | ICD-10-CM | POA: Diagnosis not present

## 2024-04-26 DIAGNOSIS — H2513 Age-related nuclear cataract, bilateral: Secondary | ICD-10-CM | POA: Diagnosis not present

## 2024-04-26 DIAGNOSIS — H52223 Regular astigmatism, bilateral: Secondary | ICD-10-CM | POA: Diagnosis not present

## 2024-04-26 DIAGNOSIS — H353131 Nonexudative age-related macular degeneration, bilateral, early dry stage: Secondary | ICD-10-CM | POA: Diagnosis not present

## 2024-04-26 DIAGNOSIS — H40023 Open angle with borderline findings, high risk, bilateral: Secondary | ICD-10-CM | POA: Diagnosis not present

## 2024-06-03 DIAGNOSIS — I10 Essential (primary) hypertension: Secondary | ICD-10-CM | POA: Diagnosis not present

## 2024-06-03 DIAGNOSIS — I714 Abdominal aortic aneurysm, without rupture, unspecified: Secondary | ICD-10-CM | POA: Diagnosis not present

## 2024-06-03 DIAGNOSIS — E039 Hypothyroidism, unspecified: Secondary | ICD-10-CM | POA: Diagnosis not present

## 2024-06-03 DIAGNOSIS — E7800 Pure hypercholesterolemia, unspecified: Secondary | ICD-10-CM | POA: Diagnosis not present

## 2024-10-07 ENCOUNTER — Emergency Department

## 2024-10-07 ENCOUNTER — Emergency Department
Admission: EM | Admit: 2024-10-07 | Discharge: 2024-10-07 | Disposition: A | Discharge status: Home / Self Care | Attending: Emergency Medicine | Admitting: Emergency Medicine

## 2024-10-07 ENCOUNTER — Other Ambulatory Visit: Payer: Self-pay

## 2024-10-07 DIAGNOSIS — I10 Essential (primary) hypertension: Secondary | ICD-10-CM | POA: Insufficient documentation

## 2024-10-07 DIAGNOSIS — E039 Hypothyroidism, unspecified: Secondary | ICD-10-CM | POA: Insufficient documentation

## 2024-10-07 DIAGNOSIS — S4992XA Unspecified injury of left shoulder and upper arm, initial encounter: Secondary | ICD-10-CM | POA: Diagnosis present

## 2024-10-07 DIAGNOSIS — W000XXA Fall on same level due to ice and snow, initial encounter: Secondary | ICD-10-CM | POA: Insufficient documentation

## 2024-10-07 DIAGNOSIS — S43015A Anterior dislocation of left humerus, initial encounter: Secondary | ICD-10-CM | POA: Insufficient documentation

## 2024-10-07 DIAGNOSIS — S43005A Unspecified dislocation of left shoulder joint, initial encounter: Secondary | ICD-10-CM

## 2024-10-07 MED ORDER — FENTANYL CITRATE (PF) 50 MCG/ML IJ SOSY
75.0000 ug | PREFILLED_SYRINGE | Freq: Once | INTRAMUSCULAR | Status: AC
  Administered 2024-10-07: 75 ug via INTRAVENOUS
  Filled 2024-10-07: qty 2

## 2024-10-07 MED ORDER — PROPOFOL 10 MG/ML IV BOLUS
40.0000 mg | Freq: Once | INTRAVENOUS | Status: DC
  Filled 2024-10-07: qty 20

## 2024-10-07 MED ORDER — PROPOFOL 10 MG/ML IV BOLUS
INTRAVENOUS | Status: AC | PRN
  Administered 2024-10-07: 40 mg via INTRAVENOUS

## 2024-10-07 MED ORDER — ONDANSETRON HCL 4 MG/2ML IJ SOLN
4.0000 mg | Freq: Once | INTRAMUSCULAR | Status: AC
  Administered 2024-10-07: 4 mg via INTRAVENOUS
  Filled 2024-10-07: qty 2

## 2024-10-07 MED ORDER — KETAMINE HCL 10 MG/ML IJ SOLN
INTRAMUSCULAR | Status: AC | PRN
  Administered 2024-10-07: 40 mg via INTRAVENOUS

## 2024-10-07 MED ORDER — ACETAMINOPHEN 500 MG PO TABS
1000.0000 mg | ORAL_TABLET | Freq: Once | ORAL | Status: AC
  Administered 2024-10-07: 1000 mg via ORAL
  Filled 2024-10-07: qty 2

## 2024-10-07 MED ORDER — KETAMINE HCL 50 MG/5ML IJ SOSY
40.0000 mg | PREFILLED_SYRINGE | Freq: Once | INTRAMUSCULAR | Status: DC
  Filled 2024-10-07 (×2): qty 5

## 2024-10-07 NOTE — ED Triage Notes (Addendum)
 Patient c/o mechanical fall onto ice yesterday; c/o left shoulder pain post fall. Denies other injury. Denies head injury, LOC/Dizziness,etc.

## 2024-10-07 NOTE — Discharge Instructions (Signed)
 Leave the sling in place.  Do not lift arm up over your head.  Okay to take out arm and do some slow downward movements.  You can take Tylenol  1 g every 8 hours to help with pain.  Call orthopedics to make a follow-up appointment in 1 week.

## 2024-10-07 NOTE — ED Provider Notes (Signed)
 "  [facility] Provider Note    Event Date/Time   First MD Initiated Contact with Patient 10/07/24 0830     (approximate)   History   Shoulder Injury   HPI  Larry Hernandez is a 79 y.o. male with history of hypertension, hypothyroidism who comes in with a shoulder injury.  Patient reports about 24 hours ago he had a mechanical fall onto ice where he landed on his left shoulder.  He denies hitting his head denies any headaches, confusion, neck injury.  He reports only pain on the left shoulder.  He denies any chest pain, shortness of breath, syncope.  He denies ever having an injury to the shoulder previously No blood thinner.  Physical Exam   Triage Vital Signs: ED Triage Vitals  Encounter Vitals Group     BP 10/07/24 0817 (!) 147/82     Girls Systolic BP Percentile --      Girls Diastolic BP Percentile --      Boys Systolic BP Percentile --      Boys Diastolic BP Percentile --      Pulse Rate 10/07/24 0817 88     Resp 10/07/24 0817 17     Temp 10/07/24 0817 98 F (36.7 C)     Temp src --      SpO2 10/07/24 0817 99 %     Weight 10/07/24 0815 200 lb (90.7 kg)     Height 10/07/24 0815 5' 10 (1.778 m)     Head Circumference --      Peak Flow --      Pain Score 10/07/24 0815 1     Pain Loc --      Pain Education --      Exclude from Growth Chart --     Most recent vital signs: Vitals:   10/07/24 0817  BP: (!) 147/82  Pulse: 88  Resp: 17  Temp: 98 F (36.7 C)  SpO2: 99%     General: Awake, no distress.  CV:  Good peripheral perfusion.  No chest wall tenderness Resp:  Normal effort.  Abd:  No distention.  Soft nontender Other:  Patient has good distal pulses.  Sensation intact.  Finger movements are all intact.  He is got some tenderness noted to the left shoulder with deformity noted. No hematoma noted to the head.  No C-spine tenderness full range of motion of neck.  No sensation changes in the arms or legs.  ED Results / Procedures /  Treatments   Labs (all labs ordered are listed, but only abnormal results are displayed) Labs Reviewed - No data to display   EKG  My interpretation of EKG:    RADIOLOGY I have reviewed the xray personally and interpreted patient has positive dislocation.   PROCEDURES:  Critical Care performed: No  .1-3 Lead EKG Interpretation  Performed by: Ernest Ronal BRAVO, MD Authorized by: Ernest Ronal BRAVO, MD     Interpretation: normal     ECG rate:  70   ECG rate assessment: normal     Rhythm: sinus rhythm     Ectopy: none     Conduction: normal   .Reduction of dislocation  Date/Time: 10/07/2024 11:38 AM  Performed by: Ernest Ronal BRAVO, MD Authorized by: Ernest Ronal BRAVO, MD  Consent: Written consent obtained Consent given by: patient Patient understanding: patient states understanding of the procedure being performed Patient consent: the patient's understanding of the procedure matches consent given Procedure consent: procedure consent matches  procedure scheduled Relevant documents: relevant documents present and verified Test results: test results available and properly labeled Site marked: the operative site was not marked Imaging studies: imaging studies available Patient identity confirmed: arm band and verbally with patient Time out: Immediately prior to procedure a time out was called to verify the correct patient, procedure, equipment, support staff and site/side marked as required. Preparation: Patient was prepped and draped in the usual sterile fashion. Local anesthesia used: no  Anesthesia: Local anesthesia used: no  Sedation: Patient sedated: yes Sedation type: moderate (conscious) sedation Sedatives: ketamine  and propofol  Analgesia: fentanyl  Sedation start date/time: 10/07/2024 11:15 AM Sedation end date/time: 10/07/2024 11:30 AM Vitals: Vital signs were monitored during sedation.  Patient tolerance: patient tolerated the procedure well with no immediate  complications   .Sedation  Date/Time: 10/07/2024 11:39 AM  Performed by: Ernest Ronal BRAVO, MD Authorized by: Ernest Ronal BRAVO, MD   Consent:    Consent obtained:  Written   Consent given by:  Patient   Risks discussed:  Dysrhythmia, allergic reaction, inadequate sedation, nausea, vomiting, respiratory compromise necessitating ventilatory assistance and intubation, prolonged sedation necessitating reversal and prolonged hypoxia resulting in organ damage Universal protocol:    Immediately prior to procedure, a time out was called: yes     Patient identity confirmed:  Anonymous protocol, patient vented/unresponsive and arm band Indications:    Procedure performed:  Dislocation reduction   Procedure necessitating sedation performed by:  Physician performing sedation Pre-sedation assessment:    Time since last food or drink:  12   NPO status caution: unable to specify NPO status     ASA classification: class 2 - patient with mild systemic disease     Mouth opening:  2 finger widths   Thyromental distance:  4 finger widths   Mallampati score:  II - soft palate, uvula, fauces visible   Neck mobility: normal     Pre-sedation assessments completed and reviewed: airway patency, cardiovascular function, hydration status, mental status, nausea/vomiting, pain level, respiratory function and temperature   A pre-sedation assessment was completed prior to the start of the procedure Immediate pre-procedure details:    Reviewed: NPO status     Verified: bag valve mask available, emergency equipment available, intubation equipment available, IV patency confirmed, oxygen available and suction available   Procedure details (see MAR for exact dosages):    Sedation:  Propofol    Intended level of sedation: deep   Intra-procedure monitoring:  Continuous capnometry and continuous pulse oximetry   Intra-procedure events: none     Total Provider sedation time (minutes):  15 Post-procedure details:   A  post-sedation assessment was completed following the completion of the procedure.   Procedure completion:  Tolerated well, no immediate complications    MEDICATIONS ORDERED IN ED: Medications  propofol  (DIPRIVAN ) 10 mg/mL bolus/IV push 40 mg (has no administration in time range)  ketamine  50 mg in normal saline 5 mL (10 mg/mL) syringe (has no administration in time range)  fentaNYL  (SUBLIMAZE ) injection 75 mcg (75 mcg Intravenous Given 10/07/24 0905)  ondansetron  (ZOFRAN ) injection 4 mg (4 mg Intravenous Given 10/07/24 0905)     IMPRESSION / MDM / ASSESSMENT AND PLAN / ED COURSE  I reviewed the triage vital signs and the nursing notes.   Patient's presentation is most consistent with acute presentation with potential threat to life or bodily function.   Patient comes in with fall.  Differential includes shoulder dislocation, fracture.  No evidence of head trauma no cervical pain and this  all happened yesterday patient is neuro intact.  Patient only complaining of shoulder pain.  Patient is neurovascularly intact.  Patient given some IV fentanyl  and attempted to do bedside reduction but unable to do therefore suspect likely due to patient's shoulder being out for most 24 hours consented patient for sedation.  Patient willing to proceed with sedation and would prefer this  IMPRESSION: 1. Anterior dislocation of the humeral head relative to the glenoid with findings of a shallow Hill-Sachs deformity.    Delay in care secondary to a critical patient coming in but patient was able to be safely sedated and shoulder was able to be reduced.  Good distal pulse afterwards.  Patient feeling improved and placed in a sling.  Understands to leave the sling on, discussed shoulder exercises to do and can follow-up outpatient with orthopedics.  12:25 PM patient continues to have good distal pulse neurovascular intact sensation intact.  We discussed precautions in regards to shoulder dislocation and need  for follow-up outpatient with orthopedics.  He expressed understanding but comfortable with this plan.  He felt comfortable taking Tylenol  for his pain.   The patient is on the cardiac monitor to evaluate for evidence of arrhythmia and/or significant heart rate changes.      FINAL CLINICAL IMPRESSION(S) / ED DIAGNOSES   Final diagnoses:  Dislocation of left shoulder joint, initial encounter     Rx / DC Orders   ED Discharge Orders     None        Note:  This document was prepared using Dragon voice recognition software and may include unintentional dictation errors.   Ernest Ronal BRAVO, MD 10/07/24 1228  "
